# Patient Record
Sex: Female | Born: 1960 | Race: White | Hispanic: No
Health system: Southern US, Community
[De-identification: ages and names within clinical notes are randomized; demographics above are authoritative.]

## PROBLEM LIST (undated history)

## (undated) DIAGNOSIS — L503 Dermatographic urticaria: Secondary | ICD-10-CM

## (undated) DIAGNOSIS — G43909 Migraine, unspecified, not intractable, without status migrainosus: Secondary | ICD-10-CM

## (undated) DIAGNOSIS — T7840XA Allergy, unspecified, initial encounter: Secondary | ICD-10-CM

## (undated) DIAGNOSIS — F32A Depression, unspecified: Secondary | ICD-10-CM

## (undated) DIAGNOSIS — Z85828 Personal history of other malignant neoplasm of skin: Secondary | ICD-10-CM

## (undated) DIAGNOSIS — F329 Major depressive disorder, single episode, unspecified: Secondary | ICD-10-CM

## (undated) DIAGNOSIS — H9313 Tinnitus, bilateral: Secondary | ICD-10-CM

## (undated) DIAGNOSIS — M797 Fibromyalgia: Secondary | ICD-10-CM

## (undated) DIAGNOSIS — R32 Unspecified urinary incontinence: Secondary | ICD-10-CM

## (undated) DIAGNOSIS — F909 Attention-deficit hyperactivity disorder, unspecified type: Secondary | ICD-10-CM

## (undated) DIAGNOSIS — C801 Malignant (primary) neoplasm, unspecified: Secondary | ICD-10-CM

## (undated) DIAGNOSIS — E889 Metabolic disorder, unspecified: Secondary | ICD-10-CM

## (undated) DIAGNOSIS — G4730 Sleep apnea, unspecified: Secondary | ICD-10-CM

## (undated) DIAGNOSIS — E785 Hyperlipidemia, unspecified: Secondary | ICD-10-CM

## (undated) DIAGNOSIS — I10 Essential (primary) hypertension: Secondary | ICD-10-CM

## (undated) DIAGNOSIS — K219 Gastro-esophageal reflux disease without esophagitis: Secondary | ICD-10-CM

## (undated) DIAGNOSIS — J45909 Unspecified asthma, uncomplicated: Secondary | ICD-10-CM

## (undated) DIAGNOSIS — E079 Disorder of thyroid, unspecified: Secondary | ICD-10-CM

## (undated) HISTORY — DX: Fibromyalgia: M79.7

## (undated) HISTORY — DX: Essential (primary) hypertension: I10

## (undated) HISTORY — DX: Personal history of other malignant neoplasm of skin: Z85.828

## (undated) HISTORY — DX: Unspecified urinary incontinence: R32

## (undated) HISTORY — DX: Gastro-esophageal reflux disease without esophagitis: K21.9

## (undated) HISTORY — DX: Disorder of thyroid, unspecified: E07.9

## (undated) HISTORY — DX: Sleep apnea, unspecified: G47.30

## (undated) HISTORY — DX: Malignant (primary) neoplasm, unspecified: C80.1

## (undated) HISTORY — DX: Dermatographic urticaria: L50.3

## (undated) HISTORY — DX: Major depressive disorder, single episode, unspecified: F32.9

## (undated) HISTORY — DX: Tinnitus, bilateral: H93.13

## (undated) HISTORY — DX: Attention-deficit hyperactivity disorder, unspecified type: F90.9

## (undated) HISTORY — DX: Depression, unspecified: F32.A

## (undated) HISTORY — PX: ABDOMINAL HYSTERECTOMY: SHX81

## (undated) HISTORY — DX: Unspecified asthma, uncomplicated: J45.909

## (undated) HISTORY — DX: Allergy, unspecified, initial encounter: T78.40XA

## (undated) HISTORY — PX: OTHER SURGICAL HISTORY: SHX169

## (undated) HISTORY — DX: Migraine, unspecified, not intractable, without status migrainosus: G43.909

## (undated) HISTORY — DX: Metabolic disorder, unspecified: E88.9

## (undated) HISTORY — PX: COLONOSCOPY: SHX174

## (undated) HISTORY — DX: Hyperlipidemia, unspecified: E78.5

---

## 1997-12-26 DIAGNOSIS — M797 Fibromyalgia: Secondary | ICD-10-CM

## 1997-12-26 HISTORY — DX: Fibromyalgia: M79.7

## 2009-01-01 HISTORY — PX: OTHER SURGICAL HISTORY: SHX169

## 2010-01-15 HISTORY — PX: OTHER SURGICAL HISTORY: SHX169

## 2013-12-26 DIAGNOSIS — E889 Metabolic disorder, unspecified: Secondary | ICD-10-CM

## 2013-12-26 DIAGNOSIS — G4730 Sleep apnea, unspecified: Secondary | ICD-10-CM

## 2013-12-26 HISTORY — DX: Sleep apnea, unspecified: G47.30

## 2013-12-26 HISTORY — DX: Metabolic disorder, unspecified: E88.9

## 2014-10-24 HISTORY — PX: OTHER SURGICAL HISTORY: SHX169

## 2015-03-06 HISTORY — PX: OTHER SURGICAL HISTORY: SHX169

## 2015-04-03 HISTORY — PX: OTHER SURGICAL HISTORY: SHX169

## 2016-09-25 DIAGNOSIS — Z85828 Personal history of other malignant neoplasm of skin: Secondary | ICD-10-CM

## 2016-09-25 HISTORY — DX: Personal history of other malignant neoplasm of skin: Z85.828

## 2018-06-21 LAB — HM COLONOSCOPY

## 2019-02-27 ENCOUNTER — Ambulatory Visit: Payer: BLUE CROSS/BLUE SHIELD | Admitting: Family Medicine

## 2019-02-27 ENCOUNTER — Ambulatory Visit: Payer: Self-pay | Admitting: Family Medicine

## 2019-02-27 ENCOUNTER — Encounter: Payer: Self-pay | Admitting: Family Medicine

## 2019-02-27 VITALS — BP 124/82 | HR 86 | Temp 97.7°F | Ht 63.0 in | Wt 204.8 lb

## 2019-02-27 DIAGNOSIS — E8881 Metabolic syndrome: Secondary | ICD-10-CM

## 2019-02-27 DIAGNOSIS — Z0000 Encounter for general adult medical examination without abnormal findings: Secondary | ICD-10-CM

## 2019-02-27 DIAGNOSIS — G43909 Migraine, unspecified, not intractable, without status migrainosus: Secondary | ICD-10-CM

## 2019-02-27 DIAGNOSIS — Z1589 Genetic susceptibility to other disease: Secondary | ICD-10-CM | POA: Insufficient documentation

## 2019-02-27 DIAGNOSIS — M797 Fibromyalgia: Secondary | ICD-10-CM | POA: Diagnosis not present

## 2019-02-27 DIAGNOSIS — Z85828 Personal history of other malignant neoplasm of skin: Secondary | ICD-10-CM | POA: Diagnosis not present

## 2019-02-27 DIAGNOSIS — Z800 Family history of malignant neoplasm of digestive organs: Secondary | ICD-10-CM

## 2019-02-27 DIAGNOSIS — Z7689 Persons encountering health services in other specified circumstances: Secondary | ICD-10-CM

## 2019-02-27 DIAGNOSIS — E7800 Pure hypercholesterolemia, unspecified: Secondary | ICD-10-CM

## 2019-02-27 DIAGNOSIS — F988 Other specified behavioral and emotional disorders with onset usually occurring in childhood and adolescence: Secondary | ICD-10-CM

## 2019-02-27 DIAGNOSIS — B009 Herpesviral infection, unspecified: Secondary | ICD-10-CM

## 2019-02-27 DIAGNOSIS — E7212 Methylenetetrahydrofolate reductase deficiency: Secondary | ICD-10-CM

## 2019-02-27 MED ORDER — PREGABALIN 50 MG PO CAPS: 50.0000 mg | ORAL_CAPSULE | Freq: Three times a day (TID) | ORAL | 3 refills | Status: DC

## 2019-02-27 MED ORDER — VALACYCLOVIR HCL 500 MG PO TABS: 500.0000 mg | ORAL_TABLET | Freq: Every day | ORAL | 3 refills | Status: DC

## 2019-02-27 MED ORDER — DEPLIN 15 15-90.314 MG PO CAPS: 15.0000 mg | ORAL_CAPSULE | Freq: Every day | ORAL | 3 refills | Status: DC

## 2019-02-27 MED ORDER — RIZATRIPTAN BENZOATE 5 MG PO TABS: 5.0000 mg | ORAL_TABLET | ORAL | 2 refills | Status: DC | PRN

## 2019-02-27 MED ORDER — LISDEXAMFETAMINE DIMESYLATE 30 MG PO CAPS: 30.0000 mg | ORAL_CAPSULE | Freq: Every day | ORAL | 0 refills | Status: DC

## 2019-02-27 NOTE — Addendum Note (Signed)
Addended by: Ronnald Nian on: 02/27/2019 04:02 PM   Modules accepted: Orders

## 2019-02-27 NOTE — Addendum Note (Signed)
Addended by: Ronnald Nian on: 02/27/2019 04:25 PM   Modules accepted: Orders

## 2019-02-27 NOTE — Progress Notes (Signed)
Angela [name] is a 58 y.o. female  Chief Complaint  Patient presents with  . Establish Care    est care/ last colonoscopy 06/21/2018(couple polyps pre cancerous-benign)/ mammogram 2018- cysts nothing concerning/ abnormal pap december 2019-- seen oncologist came back fine/ going to find out Angela [name] is a 58 y.o. female here to establish care with our office. She recently moved from [place] to be closer to her son and her 2 grandchildren. She is from [place] originally. Pt is a retired Marine scientist.  She has worked with a naturopath in [employer] and also benefits from acupuncture.   She has multiple food restrictions due to h/o dermatographia, fibromyalgia. She has a h/o depression, bipolar. She also endorses MTHFR gene mutation and was recommended to take deplin. This has helped tremendously with her depression and mental health.  Pt takes deplin 15mg  daily and Rx is sent to [facility]. 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 She needs refill of this med.   Specialists: rheumatologist (needs referral), ortho (Dr. Alphonzo Severance and pt has appt scheduled), derm (h/o basal cell Lt lower lid)  Last PAP: 11/2018 - abnormal but further eval (? With oncology) was normal/fine Last mammo: 2018 Last colonoscopy: 05/2018 - pre-cancerous polyps as per pt (mother and MGM with h/o CRC) and pt is due in 05/2021  Exercise - 3x/wk - elliptical, treadmill. Pt states she has never been formally diagnosed with chronic fatigue but believes she has it and states it requires 2-3 days to recover from 1 workout Diet - she has multiple food/dietary restrictions and has to "limit protein intake d/t issue with sulfur metabolism"  Pt notes a h/o valley fever and was "very, very sick" x 2  years. She states she last saw pulm in 2010 and was told she needed to f/u PRN. She states she has not had a CT chest since 2010 to f/u on spot in ? Rt lung. Pt is and has never been a smoker. She endorses second hand smoke throughout childhood.   Pt takes vyvanse 30mg  PRN. She has started to take this more frequently recently as she is studying for recert of RN license. She also takes ambien PRN.   Med refills needed today: see above   Past Medical History:  Diagnosis Date  . ADHD   . Allergy   . Asthma   . Cancer ([place])    [place]  . Depression   . Fibromyalgia 1999  . GERD (gastroesophageal reflux disease)   . Hx of skin cancer, basal cell 09/2016   L lower eyelid  . Hyperlipidemia   . Metabolic disorder 8315   leaky gut  . Migraines   . Sleep apnea 2015  . Thyroid disease   . Tinnitus, bilateral   . Urine incontinence     Past Surgical History:  Procedure Laterality Date  . ABDOMINAL HYSTERECTOMY     uterus only (bladder sling)  . left knee repair  10/24/2014  . left lower eyelid BCC removal    . PRP left knee  04/03/2015  . rectocele removed  01/01/2009  . right knee arthroscopy  01/15/2010  . right shoulder removal of bone spurs bursa clean up  03/06/2015    Social History   Socioeconomic  History  . Marital status: Single    Spouse name: Not on file  . Number of children: Not on file  . Years of education: Not on file  . Highest education level: Not on file  Occupational History  . Not on file  Social Needs  . Financial resource strain: Not on file  . Food insecurity:    Worry: Not on file    Inability: Not on file  . Transportation needs:    Medical: Not on file    Non-medical: Not on file  Tobacco Use  . Smoking status: Never Smoker  . Smokeless tobacco: Never Used  Substance and Sexual Activity  . Alcohol use: Yes    Comment: social  . Drug use: Never  . Sexual activity: Not on file  Lifestyle  . Physical activity:    Days per week: Not on  file    Minutes per session: Not on file  . Stress: Not on file  Relationships  . Social connections:    Talks on phone: Not on file    Gets together: Not on file    Attends religious service: Not on file    Active member of club or organization: Not on file    Attends meetings of clubs or organizations: Not on file    Relationship status: Not on file  . Intimate partner violence:    Fear of current or ex partner: Not on file    Emotionally abused: Not on file    Physically abused: Not on file    Forced sexual activity: Not on file  Other Topics Concern  . Not on file  Social History Narrative  . Not on file    Family History  Problem Relation Age of Onset  . Arthritis Mother   . Cancer Mother   . Depression Mother   . Hyperlipidemia Mother   . Hypertension Mother   . Mental illness Mother   . Alcohol abuse Father   . Asthma Father   . COPD Father   . Heart attack Father   . Hyperlipidemia Father   . Hypertension Father   . Stroke Father   . Birth defects Brother   . Asthma Son   . Cancer Maternal Grandmother   . Heart disease Maternal Grandmother   . Cancer Maternal Grandfather   . Cancer Paternal Grandmother   . Alcohol abuse Paternal Grandmother   . Alcohol abuse Brother   . Diabetes Brother   . Learning disabilities Brother   . Cancer Brother      Immunization History  Administered Date(s) Administered  . Influenza-Unspecified 10/23/2018  . Zoster Recombinat (Shingrix) 10/23/2018    Outpatient Encounter Medications as of 02/27/2019  Medication Sig  . fluticasone (FLONASE) 50 MCG/ACT nasal spray Place into both nostrils daily.  Marland Kitchen L-Methylfolate-Algae (DEPLIN 15) 15-90.314 MG CAPS Take 15 mg by mouth daily.  Marland Kitchen Lifitegrast (XIIDRA) 5 % SOLN Apply to eye.  . lisdexamfetamine (VYVANSE) 30 MG capsule Take 30 mg by mouth daily.  Marland Kitchen omega-3 acid ethyl esters (LOVAZA) 1 g capsule Take by mouth 2 (two) times daily.  . Polyethylene Glycol 3350 (MIRALAX PO) Take by  mouth.  . pregabalin (LYRICA) 50 MG capsule Take 1 capsule (50 mg total) by mouth 3 (three) times daily.  . pregabalin (LYRICA) 50 MG capsule Take 50 mg by mouth 3 (three) times daily.  . valACYclovir (VALTREX) 500 MG tablet Take 1 tablet (500 mg total) by mouth daily.  Marland Kitchen ZINC PICOLINATE PO  Take 30 mg by mouth daily.  Marland Kitchen zolpidem (AMBIEN) 5 MG tablet Take 5 mg by mouth at bedtime as needed for sleep. 1-2/mo  . [DISCONTINUED] L-Methylfolate-Algae (DEPLIN 15 PO) Take by mouth.  . [DISCONTINUED] valACYclovir (VALTREX) 500 MG tablet Take 500 mg by mouth daily.   No facility-administered encounter medications on file as of 02/27/2019.      ROS: Pertinent positives and negatives noted in HPI. Remainder of ROS non-contributory    Allergies  Allergen Reactions  . Caffeine     Heart palpatations  . Morphine And Related     Hallucinations     BP 124/82   Pulse 86   Temp 97.7 F (36.5 C) (Oral)   Ht 5\' 3"  (1.6 m)   Wt 204 lb 12.8 oz (92.9 kg)   SpO2 95%   BMI 36.28 kg/m   Physical Exam  Constitutional: She is oriented to person, place, and time. She appears well-developed and well-nourished. No distress.  Neck: Neck supple.  Cardiovascular: Normal rate and regular rhythm.  Pulmonary/Chest: Effort normal and breath sounds normal.  Lymphadenopathy:    She has no cervical adenopathy.  Neurological: She is alert and oriented to person, place, and time.  Psychiatric: She has a normal mood and affect. Her behavior is normal.     A/P:  1. Encounter to establish care with new doctor - due for CPE, labs - pt will schedule  2. History of basal cell carcinoma (BCC) of eyelid - surgery in 2017 - Ambulatory referral to Dermatology  3. Fibromyalgia Refill - pregabalin (LYRICA) 50 MG capsule; Take 1 capsule (50 mg total) by mouth 3 (three) times daily.  Dispense: 270 capsule; Refill: 3 - Ambulatory referral to Rheumatology - controlled substance agreement signed   4. Family history  of colon cancer in mother - UTD on colonoscopy - due in 2022  5. MTHFR mutation ([place]) Refill: - L-Methylfolate-Algae (DEPLIN 15) 15-90.314 MG CAPS; Take 15 mg by mouth daily.  Dispense: 90 capsule; Refill: 3  6. HSV-2 (herpes simplex virus 2) infection Refill: - valACYclovir (VALTREX) 500 MG tablet; Take 1 tablet (500 mg total) by mouth daily.  Dispense: 90 tablet; Refill: 3  8. Attention deficit disorder, unspecified hyperactivity presence - stable Refill: - lisdexamfetamine (VYVANSE) 30 MG capsule; Take 1 capsule (30 mg total) by mouth daily.  Dispense: 30 capsule; Refill: 0 - controlled substance agreement signed   I spent 45 min of face-to-face time with the patient today and greater than 50% was spent in counseling, coordination of care, education

## 2019-03-01 ENCOUNTER — Other Ambulatory Visit (INDEPENDENT_AMBULATORY_CARE_PROVIDER_SITE_OTHER): Payer: BLUE CROSS/BLUE SHIELD

## 2019-03-01 ENCOUNTER — Other Ambulatory Visit: Payer: Self-pay | Admitting: Family Medicine

## 2019-03-01 DIAGNOSIS — E7800 Pure hypercholesterolemia, unspecified: Secondary | ICD-10-CM | POA: Diagnosis not present

## 2019-03-01 DIAGNOSIS — E8881 Metabolic syndrome: Secondary | ICD-10-CM

## 2019-03-01 DIAGNOSIS — Z0000 Encounter for general adult medical examination without abnormal findings: Secondary | ICD-10-CM

## 2019-03-01 NOTE — Addendum Note (Signed)
Addended by: Diona Foley on: 03/01/2019 03:01 PM   Modules accepted: Orders

## 2019-03-04 ENCOUNTER — Encounter: Payer: Self-pay | Admitting: Family Medicine

## 2019-03-04 ENCOUNTER — Telehealth: Payer: Self-pay

## 2019-03-04 LAB — HEMOGLOBIN A1C
HEMOGLOBIN A1C: 5.7 %{Hb} — AB (ref <5.7)
MEAN PLASMA GLUCOSE: 117 (calc)
eAG (mmol/L): 6.5 (calc)

## 2019-03-04 LAB — BASIC METABOLIC PANEL
BUN: 12 mg/dL (ref 7–25)
CO2: 23 mmol/L (ref 20–32)
Calcium: 9.3 mg/dL (ref 8.6–10.4)
Chloride: 105 mmol/L (ref 98–110)
Creat: 0.66 mg/dL (ref 0.50–1.05)
Glucose, Bld: 101 mg/dL — ABNORMAL HIGH (ref 65–99)
Potassium: 4.3 mmol/L (ref 3.5–5.3)
Sodium: 140 mmol/L (ref 135–146)

## 2019-03-04 LAB — VITAMIN D 25 HYDROXY (VIT D DEFICIENCY, FRACTURES): Vit D, 25-Hydroxy: 82 ng/mL (ref 30–100)

## 2019-03-04 LAB — LIPID PANEL
CHOL/HDL RATIO: 7.5 (calc) — AB (ref <5.0)
CHOLESTEROL: 301 mg/dL — AB (ref <200)
HDL: 40 mg/dL — ABNORMAL LOW (ref >=50)
LDL Cholesterol (Calc): 208 mg/dL (calc) — ABNORMAL HIGH
Non-HDL Cholesterol (Calc): 261 mg/dL (calc) — ABNORMAL HIGH (ref <130)
Triglycerides: 311 mg/dL — ABNORMAL HIGH (ref <150)

## 2019-03-04 LAB — AST: AST: 27 U/L (ref 10–35)

## 2019-03-04 LAB — INSULIN, RANDOM: Insulin: 14.9 u[IU]/mL

## 2019-03-04 LAB — ALT: ALT: 52 U/L — AB (ref 6–29)

## 2019-03-04 NOTE — Telephone Encounter (Signed)
Copied from [name] [id number]. Topic: Referral - Status >> Mar 04, 2019  8:00 AM Oneta Rack wrote: Osvaldo Human name: Angela [name] from [facility] Call back number: [phone]   Reason for call:  As per office they don't see fibromyalgia patients, please advise

## 2019-03-06 ENCOUNTER — Encounter: Payer: Self-pay | Admitting: Family Medicine

## 2019-03-06 ENCOUNTER — Encounter: Payer: BLUE CROSS/BLUE SHIELD | Admitting: Family Medicine

## 2019-03-06 DIAGNOSIS — R7301 Impaired fasting glucose: Secondary | ICD-10-CM | POA: Insufficient documentation

## 2019-03-06 DIAGNOSIS — Z713 Dietary counseling and surveillance: Secondary | ICD-10-CM | POA: Diagnosis not present

## 2019-03-06 NOTE — Telephone Encounter (Signed)
Angela [name] can you help [name] with this referral?

## 2019-03-11 ENCOUNTER — Ambulatory Visit (INDEPENDENT_AMBULATORY_CARE_PROVIDER_SITE_OTHER): Payer: BLUE CROSS/BLUE SHIELD

## 2019-03-11 ENCOUNTER — Ambulatory Visit (INDEPENDENT_AMBULATORY_CARE_PROVIDER_SITE_OTHER): Payer: BLUE CROSS/BLUE SHIELD | Admitting: Orthopedic Surgery

## 2019-03-11 ENCOUNTER — Other Ambulatory Visit: Payer: Self-pay

## 2019-03-11 ENCOUNTER — Ambulatory Visit (INDEPENDENT_AMBULATORY_CARE_PROVIDER_SITE_OTHER): Payer: Self-pay

## 2019-03-11 ENCOUNTER — Encounter (INDEPENDENT_AMBULATORY_CARE_PROVIDER_SITE_OTHER): Payer: Self-pay | Admitting: Orthopedic Surgery

## 2019-03-11 DIAGNOSIS — M7542 Impingement syndrome of left shoulder: Secondary | ICD-10-CM

## 2019-03-11 DIAGNOSIS — M79602 Pain in left arm: Secondary | ICD-10-CM | POA: Diagnosis not present

## 2019-03-13 ENCOUNTER — Other Ambulatory Visit: Payer: Self-pay

## 2019-03-13 ENCOUNTER — Encounter: Payer: Self-pay | Admitting: Family Medicine

## 2019-03-13 ENCOUNTER — Ambulatory Visit (INDEPENDENT_AMBULATORY_CARE_PROVIDER_SITE_OTHER): Payer: BLUE CROSS/BLUE SHIELD | Admitting: Family Medicine

## 2019-03-13 ENCOUNTER — Encounter (INDEPENDENT_AMBULATORY_CARE_PROVIDER_SITE_OTHER): Payer: Self-pay | Admitting: Orthopedic Surgery

## 2019-03-13 VITALS — BP 140/88 | HR 78 | Temp 97.6°F | Ht 63.0 in | Wt 204.2 lb

## 2019-03-13 DIAGNOSIS — N6019 Diffuse cystic mastopathy of unspecified breast: Secondary | ICD-10-CM

## 2019-03-13 DIAGNOSIS — Z0000 Encounter for general adult medical examination without abnormal findings: Secondary | ICD-10-CM

## 2019-03-13 DIAGNOSIS — M62830 Muscle spasm of back: Secondary | ICD-10-CM

## 2019-03-13 DIAGNOSIS — Z23 Encounter for immunization: Secondary | ICD-10-CM

## 2019-03-13 DIAGNOSIS — Z1239 Encounter for other screening for malignant neoplasm of breast: Secondary | ICD-10-CM | POA: Diagnosis not present

## 2019-03-13 DIAGNOSIS — Z8639 Personal history of other endocrine, nutritional and metabolic disease: Secondary | ICD-10-CM

## 2019-03-13 MED ORDER — CYCLOBENZAPRINE HCL 5 MG PO TABS: ORAL_TABLET | ORAL | 0 refills | Status: DC

## 2019-03-13 NOTE — Progress Notes (Signed)
Office Visit Note   Patient: Angela [name]           Date of Birth: [birth year]           MRN: [id number] Visit Date: 03/11/2019 Requested by: No referring provider defined for this encounter. PCP: Ronnald Nian, DO  Subjective: Chief Complaint  Patient presents with  . Left Shoulder - Pain    HPI: Angela [name] is a patient with left shoulder pain.  She recently moved here from [place].  She is [name] to have surgery a year ago.  She is left-hand dominant.  She does report some neck pain and occasional radicular symptoms.  Unable to lay on that left-hand side.  She had a "bone spur surgery" in [place] on the right-hand side and did well with that.  She states the left shoulder is now following the same path in terms of clinical symptoms.  Hard for her to use her left arm.  Symptoms really worsened after she picked up her granddaughter in November 2019.  Physical therapy does help.  She uses Tylenol.  She has not had and does not want a subacromial injection.  She does have a history of fibromyalgia.  She reports decreased quality of life.              ROS: All systems reviewed are negative as they relate to the chief complaint within the history of present illness.  Patient denies  fevers or chills.   Assessment & Plan: Visit Diagnoses:  1. Left arm pain     Plan: Impression is left arm pain with likely some degree of bursitis.  I think radiculopathy is possible but less likely based on her exam.  Symptoms have been going on now for a year.  She is failed conservative management.  Needs MRI arthrogram left shoulder to evaluate for bursitis versus rotator cuff tear.  I will see her back after that study and we can proceed from there with either surgical or further nonsurgical intervention.  Follow-Up Instructions: Return for after MRI.   Orders:  Orders Placed This Encounter  Procedures  . XR Shoulder Left  . XR Cervical Spine 2 or 3 views  . MR Shoulder Left w/ contrast  .  Arthrogram   No orders of the defined types were placed in this encounter.     Procedures: No procedures performed   Clinical Data: No additional findings.  Objective: Vital Signs: There were no vitals taken for this visit.  Physical Exam:   Constitutional: Patient appears well-developed HEENT:  Head: Normocephalic Eyes:EOM are normal Neck: Normal range of motion Cardiovascular: Normal rate Pulmonary/chest: Effort normal Neurologic: Patient is alert Skin: Skin is warm Psychiatric: Patient has normal mood and affect    Ortho Exam: Ortho exam demonstrates full active and passive range of motion of the right and left shoulder.  Impingement signs positive on the left negative on the right.  Cervical spine range of motion is good.  No definite paresthesias C5-T1.  Radial pulse intact bilaterally.  No restriction of passive range of motion on the left compared to the right.  Rotator cuff strength is good isolated infraspinatus supraspinatus and subscap muscle testing.  No discrete AC joint tenderness to direct palpation left versus right.  Impingement signs negative on the right equivocal on the left  Specialty Comments:  No specialty comments available.  Imaging: No results found.   PMFS History: Patient Active Problem List   Diagnosis Date Noted  . Impaired fasting glucose  03/06/2019  . History of basal cell carcinoma (BCC) of eyelid 02/27/2019  . Fibromyalgia 02/27/2019  . 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 MTHFR mutation ([place]) 02/27/2019  . Family history of colon cancer in mother 02/27/2019  . Metabolic syndrome 22/48/2500   Past Medical History:  Diagnosis Date  . ADHD   . Allergy   . Asthma   . Cancer ([place])    [place]  . Depression   . Fibromyalgia 1999  . GERD (gastroesophageal reflux disease)   . Hx of skin cancer, basal cell 09/2016   L lower eyelid  . Hyperlipidemia   . Metabolic disorder 3704   leaky gut  . Migraines   . Sleep  apnea 2015  . Thyroid disease   . Tinnitus, bilateral   . Urine incontinence     Family History  Problem Relation Age of Onset  . Arthritis Mother   . Cancer Mother   . Depression Mother   . Hyperlipidemia Mother   . Hypertension Mother   . Mental illness Mother   . Alcohol abuse Father   . Asthma Father   . COPD Father   . Heart attack Father   . Hyperlipidemia Father   . Hypertension Father   . Stroke Father   . Birth defects Brother   . Asthma Son   . Cancer Maternal Grandmother   . Heart disease Maternal Grandmother   . Cancer Maternal Grandfather   . Cancer Paternal Grandmother   . Alcohol abuse Paternal Grandmother   . Alcohol abuse Brother   . Diabetes Brother   . Learning disabilities Brother   . Cancer Brother     Past Surgical History:  Procedure Laterality Date  . ABDOMINAL HYSTERECTOMY     uterus only (bladder sling)  . left knee repair  10/24/2014  . left lower eyelid BCC removal    . PRP left knee  04/03/2015  . rectocele removed  01/01/2009  . right knee arthroscopy  01/15/2010  . right shoulder removal of bone spurs bursa clean up  03/06/2015   Social History   Occupational History  . Not on file  Tobacco Use  . Smoking status: Never Smoker  . Smokeless tobacco: Never Used  Substance and Sexual Activity  . Alcohol use: Yes    Comment: social  . Drug use: Never  . Sexual activity: Not on file

## 2019-03-13 NOTE — Progress Notes (Signed)
Angela [name] is a 58 y.o. female  Chief Complaint  Patient presents with  . Establish Care    CPE/lab work done    HPI: Angela [name] is a 58 y.o. female here for CPE. She had labs done last week.   Since last OV with me, she saw ortho Dr. Marlou Sa on 3/16 and was diagnosed with Lt shoulder bursitis. Shoulder xray and cervical spine xray were normal. Plan, as per pt, is for MRI to r/o labral tear.   Pt states she is due for mammogram. She has fibrocystic breast disease. She needs screening mammo and for the past few years has had B/L ultrasound at same time as mammo.   She had pneumonia vaccine (3 years ago), shingrix vaccine, flu vaccine this season. She believes her last tetanus was 7+ years ago.   Last PAP: 11/2018 Last mammo: 2018 Last colonoscopy: 05/2018 - due in 05/2021  Med refills needed today? none   Past Medical History:  Diagnosis Date  . ADHD   . Allergy   . Asthma   . Cancer ([place])    [place]  . Depression   . Fibromyalgia 1999  . GERD (gastroesophageal reflux disease)   . Hx of skin cancer, basal cell 09/2016   L lower eyelid  . Hyperlipidemia   . Metabolic disorder 7829   leaky gut  . Migraines   . Sleep apnea 2015  . Thyroid disease   . Tinnitus, bilateral   . Urine incontinence     Past Surgical History:  Procedure Laterality Date  . ABDOMINAL HYSTERECTOMY     uterus only (bladder sling)  . left knee repair  10/24/2014  . left lower eyelid BCC removal    . PRP left knee  04/03/2015  . rectocele removed  01/01/2009  . right knee arthroscopy  01/15/2010  . right shoulder removal of bone spurs bursa clean up  03/06/2015    Social History   Socioeconomic History  . Marital status: Single    Spouse name: Not on file  . Number of children: Not on file  . Years of education: Not on file  . Highest education level: Not on file  Occupational History  . Not on file  Social Needs  . Financial resource strain: Not on file  . Food  insecurity:    Worry: Not on file    Inability: Not on file  . Transportation needs:    Medical: Not on file    Non-medical: Not on file  Tobacco Use  . Smoking status: Never Smoker  . Smokeless tobacco: Never Used  Substance and Sexual Activity  . Alcohol use: Yes    Comment: social  . Drug use: Never  . Sexual activity: Not on file  Lifestyle  . Physical activity:    Days per week: Not on file    Minutes per session: Not on file  . Stress: Not on file  Relationships  . Social connections:    Talks on phone: Not on file    Gets together: Not on file    Attends religious service: Not on file    Active member of club or organization: Not on file    Attends meetings of clubs or organizations: Not on file    Relationship status: Not on file  . Intimate partner violence:    Fear of current or ex partner: Not on file    Emotionally abused: Not on file    Physically abused: Not on file  Forced sexual activity: Not on file  Other Topics Concern  . Not on file  Social History Narrative  . Not on file    Family History  Problem Relation Age of Onset  . Arthritis Mother   . Cancer Mother   . Depression Mother   . Hyperlipidemia Mother   . Hypertension Mother   . Mental illness Mother   . Alcohol abuse Father   . Asthma Father   . COPD Father   . Heart attack Father   . Hyperlipidemia Father   . Hypertension Father   . Stroke Father   . Birth defects Brother   . Asthma Son   . Cancer Maternal Grandmother   . Heart disease Maternal Grandmother   . Cancer Maternal Grandfather   . Cancer Paternal Grandmother   . Alcohol abuse Paternal Grandmother   . Alcohol abuse Brother   . Diabetes Brother   . Learning disabilities Brother   . Cancer Brother      Immunization History  Administered Date(s) Administered  . Influenza-Unspecified 10/23/2018  . Zoster Recombinat (Shingrix) 10/23/2018    Outpatient Encounter Medications as of 03/13/2019  Medication Sig  .  fluticasone (FLONASE) 50 MCG/ACT nasal spray Place into both nostrils daily.  Marland Kitchen L-Methylfolate-Algae (DEPLIN 15) 15-90.314 MG CAPS Take 15 mg by mouth daily.  Marland Kitchen Lifitegrast (XIIDRA) 5 % SOLN Apply to eye.  . lisdexamfetamine (VYVANSE) 30 MG capsule Take 1 capsule (30 mg total) by mouth daily.  Marland Kitchen omega-3 acid ethyl esters (LOVAZA) 1 g capsule Take by mouth 2 (two) times daily.  . Polyethylene Glycol 3350 (MIRALAX PO) Take by mouth.  . pregabalin (LYRICA) 50 MG capsule Take 1 capsule (50 mg total) by mouth 3 (three) times daily.  . pregabalin (LYRICA) 50 MG capsule Take 50 mg by mouth 3 (three) times daily.  . rizatriptan (MAXALT) 5 MG tablet Take 1 tablet (5 mg total) by mouth as needed for migraine. May repeat in 2 hours if needed  . valACYclovir (VALTREX) 500 MG tablet Take 1 tablet (500 mg total) by mouth daily.  Marland Kitchen ZINC PICOLINATE PO Take 30 mg by mouth daily.  Marland Kitchen zolpidem (AMBIEN) 5 MG tablet Take 5 mg by mouth at bedtime as needed for sleep. 1-2/mo  . cyclobenzaprine (FLEXERIL) 5 MG tablet 1-2 tabs po qHS PRN   No facility-administered encounter medications on file as of 03/13/2019.      ROS: Gen: no fever, chills  Skin: + dermatographism  ENT: no ear pain, ear drainage, nasal congestion, rhinorrhea, sinus pressure, sore throat Eyes: no blurry vision, double vision Resp: no cough, wheeze, SOB Breast: + breast tenderness - chronic, no nipple discharge, h/o fibrocystic breast disease CV: no CP, palpitations, LE edema,  GI: no heartburn, n/v/d/c, abd pain GU: no dysuria, urgency, frequency, hematuria MSK: no joint pain, myalgias, + back pain Neuro: no dizziness, headache, weakness Psych: + depression, + anxiety, insomnia  Allergies  Allergen Reactions  . Caffeine     Heart palpatations  . Morphine And Related     Hallucinations     BP 140/88   Pulse 78   Temp 97.6 F (36.4 C) (Oral)   Ht 5\' 3"  (1.6 m)   Wt 204 lb 3.2 oz (92.6 kg)   SpO2 97%   BMI 36.17 kg/m   BP  Readings from Last 3 Encounters:  03/13/19 140/88  02/27/19 124/82     Physical Exam  Constitutional: She is oriented to person, place, and time. She  appears well-developed and well-nourished. No distress.  HENT:  Head: Normocephalic and atraumatic.  Right Ear: Tympanic membrane and ear canal normal.  Left Ear: Tympanic membrane and ear canal normal.  Nose: Nose normal.  Mouth/Throat: Oropharynx is clear and moist and mucous membranes are normal.  Neck: Neck supple. No thyromegaly present.  Cardiovascular: Normal rate, regular rhythm and normal heart sounds.  No murmur heard. Pulmonary/Chest: Effort normal and breath sounds normal. No respiratory distress.  Abdominal: Soft. Bowel sounds are normal. She exhibits no distension and no mass. There is no abdominal tenderness.  Musculoskeletal:        General: No edema.  Lymphadenopathy:    She has no cervical adenopathy.  Neurological: She is alert and oriented to person, place, and time. Coordination normal.  Skin: Skin is warm and dry.  Psychiatric: She has a normal mood and affect. Her behavior is normal.     A/P:  1. Screening for breast cancer 2. Fibrocystic breast disease (FCBD) in female, unspecified laterality - MM DIGITAL SCREENING BILATERAL; Future - US BREAST COMPLETE UNI RIGHT INC AXILLA; Future - US BREAST COMPLETE UNI LEFT INC AXILLA; Future  3. Annual physical exam - labs done previously and reviewed today - UTD on dental and vision exams - discussed importance of regular CV exercise, healthy diet, adequate sleep - immunizations UTD - next CPE in 1 year  4. Need for Tdap vaccination - Tdap vaccine greater than or equal to 7yo IM  5. Spasm of muscle of lower back - heating pad 2-3x/day Rx: - cyclobenzaprine (FLEXERIL) 5 MG tablet; 1-2 tabs po qHS PRN  Dispense: 20 tablet; Refill: 0 - f/u PRN  6. H/O thyroid cyst - US THYROID; Future

## 2019-03-13 NOTE — Patient Instructions (Signed)

## 2019-03-15 ENCOUNTER — Encounter: Payer: Self-pay | Admitting: Family Medicine

## 2019-03-18 ENCOUNTER — Other Ambulatory Visit: Payer: Self-pay

## 2019-03-18 ENCOUNTER — Ambulatory Visit
Admission: RE | Admit: 2019-03-18 | Discharge: 2019-03-18 | Disposition: A | Discharge status: Home / Self Care | Payer: BLUE CROSS/BLUE SHIELD | Source: Ambulatory Visit | Attending: Family Medicine | Admitting: Family Medicine

## 2019-03-18 ENCOUNTER — Encounter: Payer: Self-pay | Admitting: Family Medicine

## 2019-03-18 DIAGNOSIS — E042 Nontoxic multinodular goiter: Secondary | ICD-10-CM | POA: Diagnosis not present

## 2019-03-18 DIAGNOSIS — Z8639 Personal history of other endocrine, nutritional and metabolic disease: Secondary | ICD-10-CM

## 2019-03-19 ENCOUNTER — Encounter: Payer: Self-pay | Admitting: Family Medicine

## 2019-03-19 DIAGNOSIS — E042 Nontoxic multinodular goiter: Secondary | ICD-10-CM

## 2019-04-05 ENCOUNTER — Ambulatory Visit: Payer: BLUE CROSS/BLUE SHIELD

## 2019-04-05 ENCOUNTER — Other Ambulatory Visit: Payer: Self-pay | Admitting: Family Medicine

## 2019-04-05 ENCOUNTER — Other Ambulatory Visit: Payer: Self-pay

## 2019-04-05 ENCOUNTER — Ambulatory Visit
Admission: RE | Admit: 2019-04-05 | Discharge: 2019-04-05 | Disposition: A | Discharge status: Home / Self Care | Payer: BLUE CROSS/BLUE SHIELD | Source: Ambulatory Visit | Attending: Family Medicine | Admitting: Family Medicine

## 2019-04-05 DIAGNOSIS — N6019 Diffuse cystic mastopathy of unspecified breast: Secondary | ICD-10-CM | POA: Diagnosis not present

## 2019-04-05 DIAGNOSIS — Z1239 Encounter for other screening for malignant neoplasm of breast: Secondary | ICD-10-CM

## 2019-04-15 ENCOUNTER — Ambulatory Visit: Payer: BLUE CROSS/BLUE SHIELD | Admitting: Internal Medicine

## 2019-04-15 NOTE — Progress Notes (Deleted)
Name: Angela [name]  MRN/ DOB: [id number], 1961/08/16    Age/ Sex: 58 y.o., female    PCP: Ronnald Nian, DO   Reason for Endocrinology Evaluation: MNG     Date of Initial Endocrinology Evaluation: 04/15/2019     HPI: Ms. Angela [name] is a 58 y.o. female with a past medical history of Depression, GERD,and fibromylagia. The patient presented for initial endocrinology clinic visit on 04/15/2019 for consultative assistance with her MNG.   ***  HISTORY:  Past Medical History:  Past Medical History:  Diagnosis Date  . ADHD   . Allergy   . Asthma   . Cancer ([place])    [place]  . Depression   . Fibromyalgia 1999  . GERD (gastroesophageal reflux disease)   . Hx of skin cancer, basal cell 09/2016   L lower eyelid  . Hyperlipidemia   . Metabolic disorder 0865   leaky gut  . Migraines   . Sleep apnea 2015  . Thyroid disease   . Tinnitus, bilateral   . Urine incontinence     Past Surgical History:  Past Surgical History:  Procedure Laterality Date  . ABDOMINAL HYSTERECTOMY     uterus only (bladder sling)  . left knee repair  10/24/2014  . left lower eyelid BCC removal    . PRP left knee  04/03/2015  . rectocele removed  01/01/2009  . right knee arthroscopy  01/15/2010  . right shoulder removal of bone spurs bursa clean up  03/06/2015      Social History:  reports that she has never smoked. She has never used smokeless tobacco. She reports current alcohol use. She reports that she does not use drugs.  Family History: family history includes Alcohol abuse in her brother, father, and paternal grandmother; Arthritis in her mother; Asthma in her father and son; Birth defects in her brother; COPD in her father; Cancer in her brother, maternal grandfather, maternal grandmother, mother, and paternal grandmother; Depression in her mother; Diabetes in her brother; Heart attack in her father; Heart disease in her maternal grandmother; Hyperlipidemia in her father and  mother; Hypertension in her father and mother; Learning disabilities in her brother; Mental illness in her mother; Stroke in her father.   HOME MEDICATIONS: Allergies as of 04/15/2019      Reactions   Caffeine    Heart palpatations   Morphine And Related    Hallucinations      Medication List       Accurate as of April 15, 2019 11:13 AM. Always use your most recent med list.        Ambien 5 MG tablet Generic drug:  zolpidem Take 5 mg by mouth at bedtime as needed for sleep. 1-2/mo   cyclobenzaprine 5 MG tablet Commonly known as:  FLEXERIL 1-2 tabs po qHS PRN   Deplin 15 15-90.314 MG Caps Take 15 mg by mouth daily.   fluticasone 50 MCG/ACT nasal spray Commonly known as:  FLONASE Place into both nostrils daily.   lisdexamfetamine 30 MG capsule Commonly known as:  VYVANSE Take 1 capsule (30 mg total) by mouth daily.   MIRALAX PO Take by mouth.   omega-3 acid ethyl esters 1 g capsule Commonly known as:  LOVAZA Take by mouth 2 (two) times daily.   pregabalin 50 MG capsule Commonly known as:  LYRICA Take 50 mg by mouth 3 (three) times daily.   pregabalin 50 MG capsule Commonly known as:  Lyrica Take 1 capsule (50 mg total)  by mouth 3 (three) times daily.   rizatriptan 5 MG tablet Commonly known as:  Maxalt Take 1 tablet (5 mg total) by mouth as needed for migraine. May repeat in 2 hours if needed   valACYclovir 500 MG tablet Commonly known as:  VALTREX Take 1 tablet (500 mg total) by mouth daily.   Xiidra 5 % Soln Generic drug:  Lifitegrast Apply to eye.   ZINC PICOLINATE PO Take 30 mg by mouth daily.         REVIEW OF SYSTEMS: A comprehensive ROS was conducted with the patient and is negative except as per HPI and below:  ROS     OBJECTIVE:  VS: There were no vitals taken for this visit.   Wt Readings from Last 3 Encounters:  03/13/19 204 lb 3.2 oz (92.6 kg)  02/27/19 204 lb 12.8 oz (92.9 kg)     EXAM: General: Pt appears well and is  in NAD  Hydration: Well-hydrated with moist mucous membranes and good skin turgor  Eyes: External eye exam normal without stare, lid lag or exophthalmos.  EOM intact.  PERRL.  Ears, Nose, Throat: Hearing: Grossly intact bilaterally Dental: Good dentition  Throat: Clear without mass, erythema or exudate  Neck: General: Supple without adenopathy. Thyroid: Thyroid size normal.  No goiter or nodules appreciated. No thyroid bruit.  Lungs: Clear with good BS bilat with no rales, rhonchi, or wheezes  Heart: Auscultation: RRR.  Abdomen: Normoactive bowel sounds, soft, nontender, without masses or organomegaly palpable  Extremities: Gait and station: Normal gait  Digits and nails: No clubbing, cyanosis, petechiae, or nodes Head and neck: Normal alignment and mobility BL UE: Normal ROM and strength. BL LE: No pretibial edema normal ROM and strength.  Skin: Hair: Texture and amount normal with gender appropriate distribution Skin Inspection: No rashes, acanthosis nigricans/skin tags. No lipohypertrophy Skin Palpation: Skin temperature, texture, and thickness normal to palpation  Neuro: Cranial nerves: II - XII grossly intact  Cerebellar: Normal coordination and movement; no tremor Motor: Normal strength throughout DTRs: 2+ and symmetric in UE without delay in relaxation phase  Mental Status: Judgment, insight: Intact Orientation: Oriented to time, place, and person Memory: Intact for recent and remote events Mood and affect: No depression, anxiety, or agitation     DATA REVIEWED:  Thyroid Ultrasound (03/18/2019)  Nodule # 1:  Location: Right; Superior  Maximum size: 1.7 cm; Other 2 dimensions: 1.1 x 0.8 cm  Composition: solid/almost completely solid (2)  Echogenicity: isoechoic (1)  Shape: not taller-than-wide (0)  Margins: ill-defined (0)  Echogenic foci: none (0)  ACR TI-RADS total points: 3.  ACR TI-RADS risk category: TR3 (3 points).  ACR TI-RADS  recommendations:  *Given size (>/= 1.5 - 2.4 cm) and appearance, a follow-up ultrasound in 1 year should be considered based on TI-RADS criteria.  _________________________________________________________  Nodule # 2:  Location: Right; Mid  Maximum size: 1.4 cm; Other 2 dimensions: 1.2 x 1 cm  Composition: mixed cystic and solid (1)  Echogenicity: isoechoic (1)  Shape: not taller-than-wide (0)  Margins: ill-defined (0)  Echogenic foci: none (0)  ACR TI-RADS total points: 2.  ACR TI-RADS risk category: TR2 (2 points).  ACR TI-RADS recommendations:  This nodule does NOT meet TI-RADS criteria for biopsy or dedicated follow-up.  _________________________________________________________  Nodule # 3:  Location: Right; Inferior  Maximum size: 1.8 cm; Other 2 dimensions: 1.2 x 1 cm  Composition: solid/almost completely solid (2)  Echogenicity: hypoechoic (2)  Shape: not taller-than-wide (0)  Margins:  ill-defined (0)  Echogenic foci:  ACR TI-RADS total points: 4.  ACR TI-RADS risk category: TR4 (4-6 points).  ACR TI-RADS recommendations:  **Given size (>/= 1.5 cm) and appearance, fine needle aspiration of this moderately suspicious nodule should be considered based on TI-RADS criteria.  _________________________________________________________  Nodule # 4:  Location: Left; Mid  Maximum size: 0.7 cm; Other 2 dimensions: 0.6 x 0.6 cm  Composition: solid/almost completely solid (2)  Echogenicity: hypoechoic (2)  Shape: not taller-than-wide (0)  Margins: ill-defined (0)  Echogenic foci: none (0)  ACR TI-RADS total points: 4.  ACR TI-RADS risk category: TR4 (4-6 points).  ACR TI-RADS recommendations:  Given size (<0.9 cm) and appearance, this nodule does NOT meet TI-RADS criteria for biopsy or dedicated follow-up.  IMPRESSION: 1. Mild thyromegaly with bilateral nodules. 2. In the absence of prior  studies documenting long-term stability, recommend FNA biopsy of moderately suspicious 1.8 cm inferior right nodule. 3. Recommend annual/biennial ultrasound follow-up of superior right nodule as above, until stability x5 years confirmed.     ASSESSMENT/PLAN/RECOMMENDATIONS:   1. Multinodular Goiter :    Medications :  Signed electronically by: Mack Guise, MD  [facility] Endocrinology  [facility] [address] [place], [place] [address] Phone: [phone] FAX: [phone]   CC: Ronnald Nian, [place] [address] Phone: [phone] Fax: [phone]   Return to Endocrinology clinic as below: Future Appointments  Date Time Provider [place]  04/15/2019  1:20 PM Angelo Prindle, Melanie Crazier, MD [id number] None

## 2019-04-17 ENCOUNTER — Other Ambulatory Visit: Payer: BLUE CROSS/BLUE SHIELD

## 2019-04-22 ENCOUNTER — Encounter: Payer: Self-pay | Admitting: Internal Medicine

## 2019-04-22 ENCOUNTER — Ambulatory Visit: Payer: BLUE CROSS/BLUE SHIELD | Admitting: Internal Medicine

## 2019-04-22 ENCOUNTER — Other Ambulatory Visit: Payer: Self-pay

## 2019-04-22 VITALS — BP 142/78 | HR 85 | Temp 98.0°F | Ht 63.0 in | Wt 204.6 lb

## 2019-04-22 DIAGNOSIS — E042 Nontoxic multinodular goiter: Secondary | ICD-10-CM

## 2019-04-22 NOTE — Progress Notes (Signed)
Name: Angela [name]  MRN/ DOB: [id number], 03/22/1961    Age/ Sex: 58 y.o., female    PCP: Ronnald Nian, DO   Reason for Endocrinology Evaluation: MNG     Date of Initial Endocrinology Evaluation: 04/22/2019     HPI: Angela [name] is a 58 y.o. female with a past medical history of Depression, GERD,dermatographia and fibromylagia. The patient presented for initial endocrinology clinic visit on 04/22/2019 for consultative assistance with her MNG.   Moved from [facility]   She has had thyroid nodules for ~ 8 yrs. This was followed by ENT in [place]. No prior biopsies.   Four years ago she was on levothyroxine for ~ 1.5 yrs, with normal TFT's but with hypothyroid symptoms. She eventually developed hyperthyroid symptoms and had to stop it.   Recently she denies local neck symptoms.  She has noted weight gain, and has chronic constipation      Materanal GM goiter Mother- hypothyroidism Brother - hyperthyroidism  HISTORY:  Past Medical History:  Past Medical History:  Diagnosis Date  . ADHD   . Allergy   . Asthma   . Cancer ([place])    [place]  . Depression   . Fibromyalgia 1999  . GERD (gastroesophageal reflux disease)   . Hx of skin cancer, basal cell 09/2016   L lower eyelid  . Hyperlipidemia   . Metabolic disorder 2423   leaky gut  . Migraines   . Sleep apnea 2015  . Thyroid disease   . Tinnitus, bilateral   . Urine incontinence    Past Surgical History:  Past Surgical History:  Procedure Laterality Date  . ABDOMINAL HYSTERECTOMY     uterus only (bladder sling)  . left knee repair  10/24/2014  . left lower eyelid BCC removal    . PRP left knee  04/03/2015  . rectocele removed  01/01/2009  . right knee arthroscopy  01/15/2010  . right shoulder removal of bone spurs bursa clean up  03/06/2015      Social History:  reports that she has never smoked. She has never used smokeless tobacco. She reports current alcohol use. She reports that she does not use  drugs.  Family History: family history includes Alcohol abuse in her brother, father, and paternal grandmother; Arthritis in her mother; Asthma in her father and son; Birth defects in her brother; COPD in her father; Cancer in her brother, maternal grandfather, maternal grandmother, mother, and paternal grandmother; Depression in her mother; Diabetes in her brother; Heart attack in her father; Heart disease in her maternal grandmother; Hyperlipidemia in her father and mother; Hypertension in her father and mother; Learning disabilities in her brother; Mental illness in her mother; Stroke in her father.   HOME MEDICATIONS: Allergies as of 04/22/2019      Reactions   Caffeine    Heart palpatations   Morphine And Related    Hallucinations      Medication List       Accurate as of April 22, 2019  2:37 PM. Always use your most recent med list.        Ambien 5 MG tablet Generic drug:  zolpidem Take 5 mg by mouth at bedtime as needed for sleep. 1-2/mo   cyclobenzaprine 5 MG tablet Commonly known as:  FLEXERIL 1-2 tabs po qHS PRN   Deplin 15 15-90.314 MG Caps Take 15 mg by mouth daily.   fluticasone 50 MCG/ACT nasal spray Commonly known as:  FLONASE Place into both nostrils daily.  lisdexamfetamine 30 MG capsule Commonly known as:  VYVANSE Take 1 capsule (30 mg total) by mouth daily.   MIRALAX PO Take by mouth.   omega-3 acid ethyl esters 1 g capsule Commonly known as:  LOVAZA Take by mouth 2 (two) times daily.   pregabalin 50 MG capsule Commonly known as:  LYRICA Take 50 mg by mouth 3 (three) times daily.   pregabalin 50 MG capsule Commonly known as:  Lyrica Take 1 capsule (50 mg total) by mouth 3 (three) times daily.   rizatriptan 5 MG tablet Commonly known as:  Maxalt Take 1 tablet (5 mg total) by mouth as needed for migraine. May repeat in 2 hours if needed   valACYclovir 500 MG tablet Commonly known as:  VALTREX Take 1 tablet (500 mg total) by mouth daily.    Xiidra 5 % Soln Generic drug:  Lifitegrast Apply to eye.   ZINC PICOLINATE PO Take 30 mg by mouth daily.         REVIEW OF SYSTEMS: A comprehensive ROS was conducted with the patient and is negative except as per HPI and below:  Review of Systems  Constitutional: Negative for weight loss.  HENT: Negative for congestion and sore throat.   Eyes: Positive for pain. Negative for blurred vision.       Chronic dry eye   Respiratory: Negative for cough and shortness of breath.   Cardiovascular: Positive for palpitations. Negative for chest pain.       Only when she lays down   Gastrointestinal: Positive for constipation. Negative for nausea.  Genitourinary: Positive for urgency. Negative for frequency.  Musculoskeletal: Positive for joint pain and myalgias.  Neurological: Positive for tingling. Negative for tremors.  Endo/Heme/Allergies: Positive for polydipsia.  Psychiatric/Behavioral: Negative for depression. The patient is not nervous/anxious.        OBJECTIVE:  VS: BP (!) 142/78 (BP Location: Left Arm, Patient Position: Sitting, Cuff Size: Normal)   Pulse 85   Temp 98 F (36.7 C)   Ht 5\' 3"  (1.6 m)   Wt 204 lb 9.6 oz (92.8 kg)   SpO2 97%   BMI 36.24 kg/m    Wt Readings from Last 3 Encounters:  04/22/19 204 lb 9.6 oz (92.8 kg)  03/13/19 204 lb 3.2 oz (92.6 kg)  02/27/19 204 lb 12.8 oz (92.9 kg)     EXAM: General: Pt appears well and is in NAD  Hydration: Well-hydrated with moist mucous membranes and good skin turgor  Eyes: External eye exam normal without stare, lid lag or exophthalmos.  EOM intact.  PERRL.  Ears, Nose, Throat: Hearing: Grossly intact bilaterally Dental: Good dentition  Throat: Clear without mass, erythema or exudate  Neck: General: Supple without adenopathy. Thyroid: Thyroid size normal.  No goiter or nodules appreciated. No thyroid bruit.  Lungs: Clear with good BS bilat with no rales, rhonchi, or wheezes  Heart: Auscultation: RRR.  Abdomen:  Normoactive bowel sounds, soft, nontender, without masses or organomegaly palpable  Extremities: Gait and station: Normal gait  Digits and nails: No clubbing, cyanosis, petechiae, or nodes Head and neck: Normal alignment and mobility BL UE: Normal ROM and strength. BL LE: No pretibial edema normal ROM and strength.  Skin: Hair: Texture and amount normal with gender appropriate distribution Skin Inspection: No rashes, acanthosis nigricans/skin tags. No lipohypertrophy Skin Palpation: Skin temperature, texture, and thickness normal to palpation  Neuro: Cranial nerves: II - XII grossly intact  Cerebellar: Normal coordination and movement; no tremor Motor: Normal strength throughout DTRs:  2+ and symmetric in UE without delay in relaxation phase  Mental Status: Judgment, insight: Intact Orientation: Oriented to time, place, and person Memory: Intact for recent and remote events Mood and affect: No depression, anxiety, or agitation     DATA REVIEWED: Results for ILY, DENNO (MRN [id number]) as of 04/23/2019 14:47  Ref. Range 04/22/2019 15:00  TSH Latest Ref Range: 0.35 - 4.50 uIU/mL 3.53  T4,Free(Direct) Latest Ref Range: 0.60 - 1.60 ng/dL 0.86   Thyroid Ultrasound (03/18/2019)  Nodule # 1:  Location: Right; Superior  Maximum size: 1.7 cm; Other 2 dimensions: 1.1 x 0.8 cm  Composition: solid/almost completely solid (2)  Echogenicity: isoechoic (1)  Shape: not taller-than-wide (0)  Margins: ill-defined (0)  Echogenic foci: none (0)  ACR TI-RADS total points: 3.  ACR TI-RADS risk category: TR3 (3 points).  ACR TI-RADS recommendations:  *Given size (>/= 1.5 - 2.4 cm) and appearance, a follow-up ultrasound in 1 year should be considered based on TI-RADS criteria.  _________________________________________________________  Nodule # 2:  Location: Right; Mid  Maximum size: 1.4 cm; Other 2 dimensions: 1.2 x 1 cm  Composition: mixed cystic and solid  (1)  Echogenicity: isoechoic (1)  Shape: not taller-than-wide (0)  Margins: ill-defined (0)  Echogenic foci: none (0)  ACR TI-RADS total points: 2.  ACR TI-RADS risk category: TR2 (2 points).  ACR TI-RADS recommendations:  This nodule does NOT meet TI-RADS criteria for biopsy or dedicated follow-up.  _________________________________________________________  Nodule # 3:  Location: Right; Inferior  Maximum size: 1.8 cm; Other 2 dimensions: 1.2 x 1 cm  Composition: solid/almost completely solid (2)  Echogenicity: hypoechoic (2)  Shape: not taller-than-wide (0)  Margins: ill-defined (0)  Echogenic foci:  ACR TI-RADS total points: 4.  ACR TI-RADS risk category: TR4 (4-6 points).  ACR TI-RADS recommendations:  **Given size (>/= 1.5 cm) and appearance, fine needle aspiration of this moderately suspicious nodule should be considered based on TI-RADS criteria.  _________________________________________________________  Nodule # 4:  Location: Left; Mid  Maximum size: 0.7 cm; Other 2 dimensions: 0.6 x 0.6 cm  Composition: solid/almost completely solid (2)  Echogenicity: hypoechoic (2)  Shape: not taller-than-wide (0)  Margins: ill-defined (0)  Echogenic foci: none (0)  ACR TI-RADS total points: 4.  ACR TI-RADS risk category: TR4 (4-6 points).  ACR TI-RADS recommendations:  Given size (<0.9 cm) and appearance, this nodule does NOT meet TI-RADS criteria for biopsy or dedicated follow-up.  IMPRESSION: 1. Mild thyromegaly with bilateral nodules. 2. In the absence of prior studies documenting long-term stability, recommend FNA biopsy of moderately suspicious 1.8 cm inferior right nodule. 3. Recommend annual/biennial ultrasound follow-up of superior right nodule as above, until stability x5 years confirmed.   Old records , labs and images have been reviewed.    ASSESSMENT/PLAN/RECOMMENDATIONS:   1. Multinodular  Goiter :  - No local neck symptoms - She is clinically and biochemicaly euthyroid - We discussed ultrasound recommendations with proceeding with FNA of the right inferior thyroid nodule with a max diameter of 1.8 cm . We discussed the risk of malignancy is ~ 10 %  - We also discussed the need for a repeat ultrasound in a year of the right superior thyroid nodule . - She understand due to the pandemic there will be a delay in scheduling her FNA  F/u in 1 yr    Signed electronically by: Mack Guise, MD  [facility] Endocrinology  [facility] [address] [place],  [address] Phone: [phone]  FAX: [phone]   CC: Ronnald Nian, DO [place] [address] Phone: [phone] Fax: [phone]   Return to Endocrinology clinic as below: Future Appointments  Date Time Provider [place]  06/06/2019  2:45 PM GI-315 DG C-ARM RM 3 [id number] GI[facility] W. WE  06/06/2019  3:10 PM GI-315 MR 1 [id number] GI-[facility] WE

## 2019-04-22 NOTE — Patient Instructions (Signed)
-   Please stop by the lab today , we will contact you with results - We will set you up for right thyroid nodule biopsy, but this will not be done until this pandemic slows down.

## 2019-04-23 LAB — T4, FREE: Free T4: 0.86 ng/dL (ref 0.60–1.60)

## 2019-04-23 LAB — TSH: TSH: 3.53 u[IU]/mL (ref 0.35–4.50)

## 2019-05-19 ENCOUNTER — Encounter: Payer: Self-pay | Admitting: Family Medicine

## 2019-05-22 ENCOUNTER — Telehealth (INDEPENDENT_AMBULATORY_CARE_PROVIDER_SITE_OTHER): Payer: BLUE CROSS/BLUE SHIELD | Admitting: Family Medicine

## 2019-05-22 ENCOUNTER — Encounter: Payer: Self-pay | Admitting: Family Medicine

## 2019-05-22 DIAGNOSIS — J069 Acute upper respiratory infection, unspecified: Secondary | ICD-10-CM

## 2019-05-22 NOTE — Progress Notes (Signed)
Virtual Visit via Video Note  I connected with Angela [name] on 05/22/19 at  3:30 PM EDT by a video enabled telemedicine application and verified that I am speaking with the correct person using two identifiers. Location patient: home Location provider: work or home office Persons participating in the virtual visit: patient, provider  I discussed the limitations of evaluation and management by telemedicine and the availability of in person appointments. The patient expressed understanding and agreed to proceed.  Chief Complaint  Patient presents with  . Nasal Congestion    right ear discomfort (crackles in ears), having petechiae ( stomach and chest under/upper arms) , no fever, a little bit of body aches, headache coming and going, dry cough worse when laying down/ started 5/14/ hasn't really taken anything OTC     HPI: Angela [name] is a 58 y.o. female complains of 2 week (started 5/14) h/o dry cough, nasal congestion, Rt ear discomfort/fullness, on/off headache, mild body aches, and now rash on her chest, abdomen, and upper arms.  No fever (she has taken temp daily), chills, GI symptoms, no SOB. No significant fatigue.  She has not taken any OTC meds.  She states symptoms were worst last week compared to this week. Overall she feels like she is improving.  She does have a h/o allergies, asthma, migraines, dermatographia.  Past Medical History:  Diagnosis Date  . ADHD   . Allergy   . Asthma   . Cancer ([place])    [place]  . Depression   . Fibromyalgia 1999  . GERD (gastroesophageal reflux disease)   . Hx of skin cancer, basal cell 09/2016   L lower eyelid  . Hyperlipidemia   . Metabolic disorder 6387   leaky gut  . Migraines   . Sleep apnea 2015  . Thyroid disease   . Tinnitus, bilateral   . Urine incontinence     Past Surgical History:  Procedure Laterality Date  . ABDOMINAL HYSTERECTOMY     uterus only (bladder sling)  . left knee repair  10/24/2014  . left  lower eyelid BCC removal    . PRP left knee  04/03/2015  . rectocele removed  01/01/2009  . right knee arthroscopy  01/15/2010  . right shoulder removal of bone spurs bursa clean up  03/06/2015    Family History  Problem Relation Age of Onset  . Arthritis Mother   . Cancer Mother   . Depression Mother   . Hyperlipidemia Mother   . Hypertension Mother   . Mental illness Mother   . Alcohol abuse Father   . Asthma Father   . COPD Father   . Heart attack Father   . Hyperlipidemia Father   . Hypertension Father   . Stroke Father   . Birth defects Brother   . Asthma Son   . Cancer Maternal Grandmother   . Heart disease Maternal Grandmother   . Cancer Maternal Grandfather   . Cancer Paternal Grandmother   . Alcohol abuse Paternal Grandmother   . Alcohol abuse Brother   . Diabetes Brother   . Learning disabilities Brother   . Cancer Brother     Social History   Tobacco Use  . Smoking status: Never Smoker  . Smokeless tobacco: Never Used  Substance Use Topics  . Alcohol use: Yes    Comment: social  . Drug use: Never     Current Outpatient Medications:  .  cyclobenzaprine (FLEXERIL) 5 MG tablet, 1-2 tabs po qHS PRN, Disp: 20 tablet,  Rfl: 0 .  fluticasone (FLONASE) 50 MCG/ACT nasal spray, Place into both nostrils daily., Disp: , Rfl:  .  L-Methylfolate-Algae (DEPLIN 15) 15-90.314 MG CAPS, Take 15 mg by mouth daily., Disp: 90 capsule, Rfl: 3 .  Lifitegrast (XIIDRA) 5 % SOLN, Apply to eye., Disp: , Rfl:  .  lisdexamfetamine (VYVANSE) 30 MG capsule, Take 1 capsule (30 mg total) by mouth daily., Disp: 30 capsule, Rfl: 0 .  omega-3 acid ethyl esters (LOVAZA) 1 g capsule, Take by mouth 2 (two) times daily., Disp: , Rfl:  .  Polyethylene Glycol 3350 (MIRALAX PO), Take by mouth., Disp: , Rfl:  .  pregabalin (LYRICA) 50 MG capsule, Take 1 capsule (50 mg total) by mouth 3 (three) times daily., Disp: 270 capsule, Rfl: 3 .  pregabalin (LYRICA) 50 MG capsule, Take 50 mg by mouth 3  (three) times daily., Disp: , Rfl:  .  rizatriptan (MAXALT) 5 MG tablet, Take 1 tablet (5 mg total) by mouth as needed for migraine. May repeat in 2 hours if needed, Disp: 10 tablet, Rfl: 2 .  valACYclovir (VALTREX) 500 MG tablet, Take 1 tablet (500 mg total) by mouth daily., Disp: 90 tablet, Rfl: 3 .  ZINC PICOLINATE PO, Take 30 mg by mouth daily., Disp: , Rfl:  .  zolpidem (AMBIEN) 5 MG tablet, Take 5 mg by mouth at bedtime as needed for sleep. 1-2/mo, Disp: , Rfl:   Allergies  Allergen Reactions  . Caffeine     Heart palpatations  . Morphine And Related     Hallucinations       ROS: See pertinent positives and negatives per HPI.   EXAM:  VITALS per patient if applicable: There were no vitals taken for this visit.   GENERAL: alert, oriented, appears well and in no acute distress  HEENT: atraumatic, conjunctiva clear, no obvious abnormalities on inspection of external nose and ears  NECK: normal movements of the head and neck  LUNGS: on inspection no signs of respiratory distress, breathing rate appears normal, no obvious gross SOB, gasping or wheezing, no conversational dyspnea  CV: no obvious cyanosis  MS: moves all visible extremities without noticeable abnormality  SKIN: difficult to assess as pt could not focus camera on affected area  PSYCH/NEURO: pleasant and cooperative, speech and thought processing grossly intact   ASSESSMENT AND PLAN: 1. Viral URI - symptoms x 2 weeks and improving overall (pt never had fever and was checking temp daily) - cont with supportive care - f/u PRN    I discussed the assessment and treatment plan with the patient. The patient was provided an opportunity to ask questions and all were answered. The patient agreed with the plan and demonstrated an understanding of the instructions.   The patient was advised to call back or seek an in-person evaluation if the symptoms worsen or if the condition fails to improve as  anticipated.   Letta Median, DO

## 2019-05-29 ENCOUNTER — Ambulatory Visit
Admission: RE | Admit: 2019-05-29 | Discharge: 2019-05-29 | Disposition: A | Discharge status: Home / Self Care | Payer: BC Managed Care – PPO | Source: Ambulatory Visit | Attending: Internal Medicine | Admitting: Internal Medicine

## 2019-05-29 ENCOUNTER — Encounter: Payer: Self-pay | Admitting: Family Medicine

## 2019-05-29 ENCOUNTER — Other Ambulatory Visit (HOSPITAL_COMMUNITY)
Admission: RE | Admit: 2019-05-29 | Discharge: 2019-05-29 | Disposition: A | Discharge status: Home / Self Care | Payer: BC Managed Care – PPO | Source: Ambulatory Visit | Attending: Radiology | Admitting: Radiology

## 2019-05-29 ENCOUNTER — Encounter: Payer: Self-pay | Admitting: *Deleted

## 2019-05-29 DIAGNOSIS — E042 Nontoxic multinodular goiter: Secondary | ICD-10-CM | POA: Diagnosis not present

## 2019-05-29 DIAGNOSIS — E041 Nontoxic single thyroid nodule: Secondary | ICD-10-CM | POA: Diagnosis not present

## 2019-06-03 DIAGNOSIS — Z713 Dietary counseling and surveillance: Secondary | ICD-10-CM | POA: Diagnosis not present

## 2019-06-06 ENCOUNTER — Ambulatory Visit
Admission: RE | Admit: 2019-06-06 | Discharge: 2019-06-06 | Disposition: A | Discharge status: Home / Self Care | Payer: BLUE CROSS/BLUE SHIELD | Source: Ambulatory Visit | Attending: Orthopedic Surgery | Admitting: Orthopedic Surgery

## 2019-06-06 ENCOUNTER — Other Ambulatory Visit: Payer: Self-pay

## 2019-06-06 ENCOUNTER — Ambulatory Visit
Admission: RE | Admit: 2019-06-06 | Discharge: 2019-06-06 | Disposition: A | Discharge status: Home / Self Care | Payer: BC Managed Care – PPO | Source: Ambulatory Visit | Attending: Orthopedic Surgery | Admitting: Orthopedic Surgery

## 2019-06-06 DIAGNOSIS — M7582 Other shoulder lesions, left shoulder: Secondary | ICD-10-CM | POA: Diagnosis not present

## 2019-06-06 DIAGNOSIS — M79602 Pain in left arm: Secondary | ICD-10-CM

## 2019-06-06 DIAGNOSIS — M25512 Pain in left shoulder: Secondary | ICD-10-CM | POA: Diagnosis not present

## 2019-06-06 MED ORDER — IOPAMIDOL (ISOVUE-M 200) INJECTION 41%
15.0000 mL | Freq: Once | INTRAMUSCULAR | Status: AC
  Administered 2019-06-06: 12 mL via INTRA_ARTICULAR

## 2019-06-07 ENCOUNTER — Encounter: Payer: Self-pay | Admitting: Family Medicine

## 2019-06-07 DIAGNOSIS — F988 Other specified behavioral and emotional disorders with onset usually occurring in childhood and adolescence: Secondary | ICD-10-CM

## 2019-06-12 ENCOUNTER — Ambulatory Visit (INDEPENDENT_AMBULATORY_CARE_PROVIDER_SITE_OTHER): Payer: BC Managed Care – PPO | Admitting: Orthopedic Surgery

## 2019-06-12 ENCOUNTER — Other Ambulatory Visit: Payer: Self-pay

## 2019-06-12 ENCOUNTER — Encounter: Payer: Self-pay | Admitting: Orthopedic Surgery

## 2019-06-12 DIAGNOSIS — M7552 Bursitis of left shoulder: Secondary | ICD-10-CM | POA: Diagnosis not present

## 2019-06-12 DIAGNOSIS — Z713 Dietary counseling and surveillance: Secondary | ICD-10-CM | POA: Diagnosis not present

## 2019-06-12 IMAGING — US US THYROID
1 series · 12 of 25 positions shown · non-contrast
Comparison: None available

CLINICAL DATA: Thyroid cysts.

EXAM:
THYROID ULTRASOUND
TECHNIQUE: Ultrasound examination of the thyroid gland and adjacent soft
tissues was performed.

[Series 1: us thyroid · 0.08mm/px · 12 of 55 slices shown]
[im 3/55]
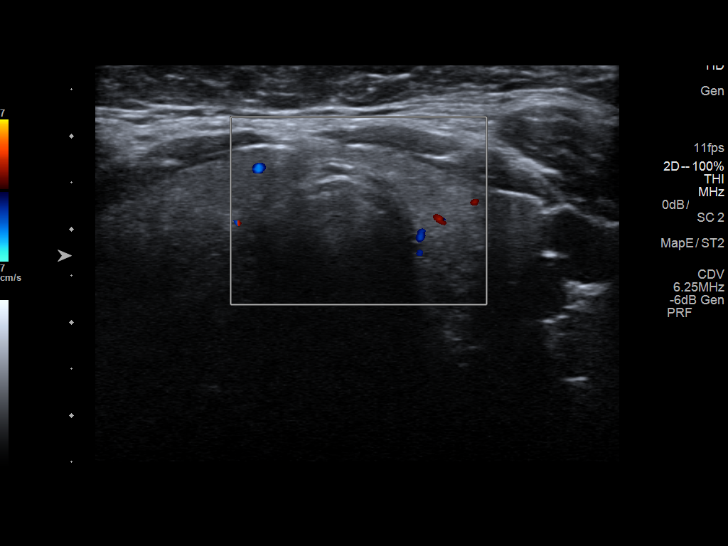
[im 7/55]
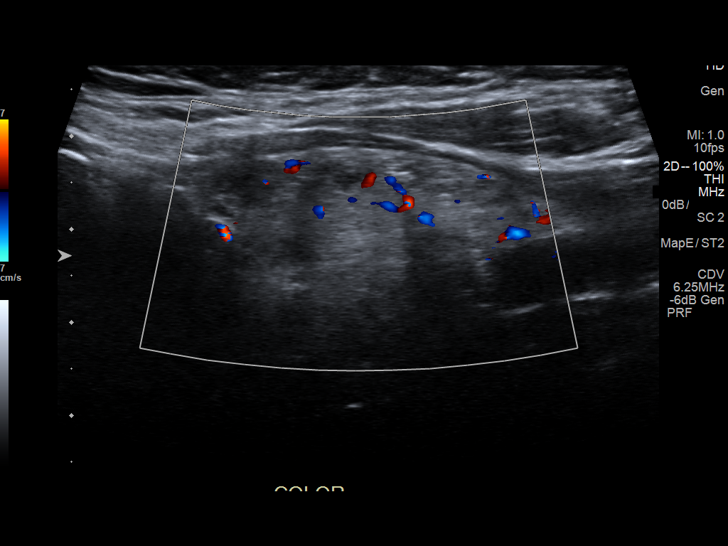
[im 12/55]
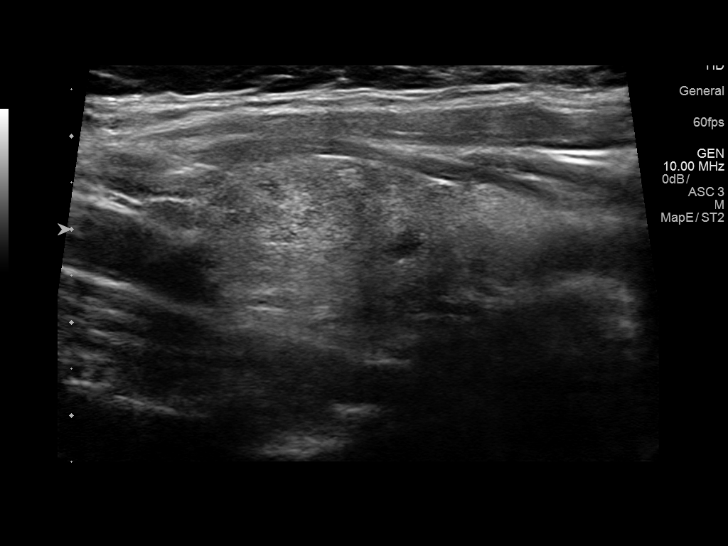
[im 16/55]
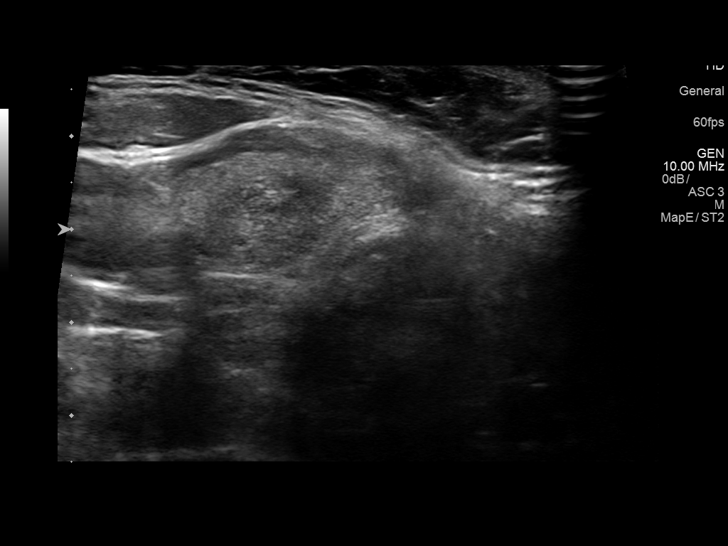
[im 21/55]
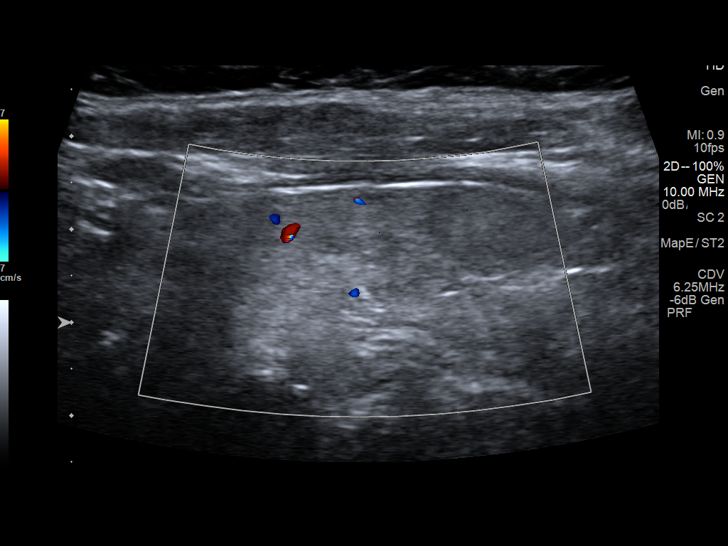
[im 25/55]
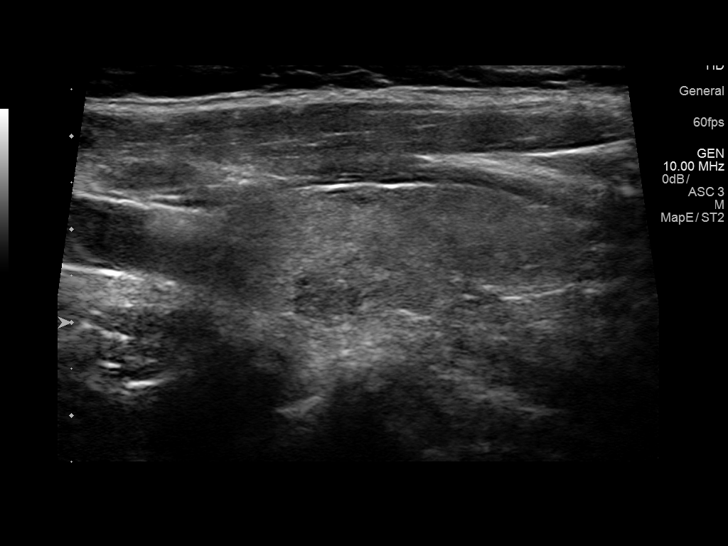
[im 30/55]
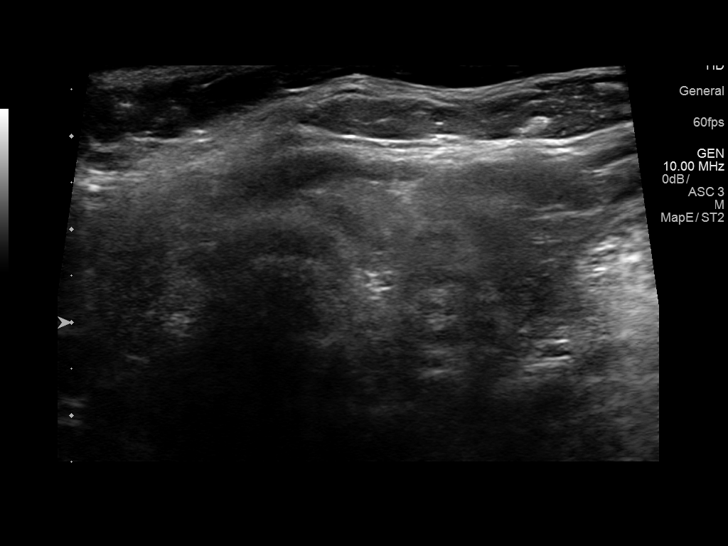
[im 34/55]
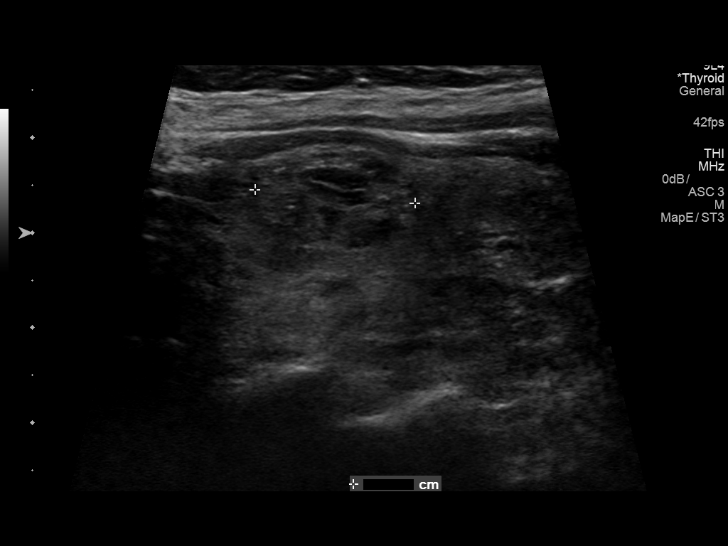
[im 39/55]
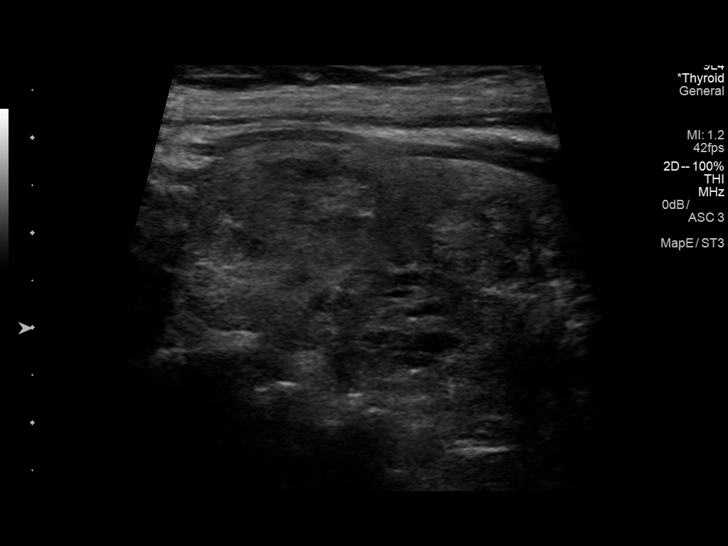
[im 43/55]
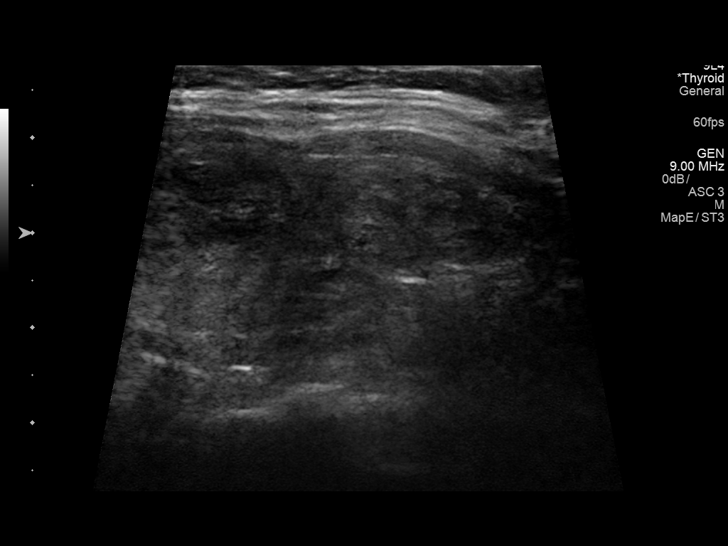
[im 48/55]
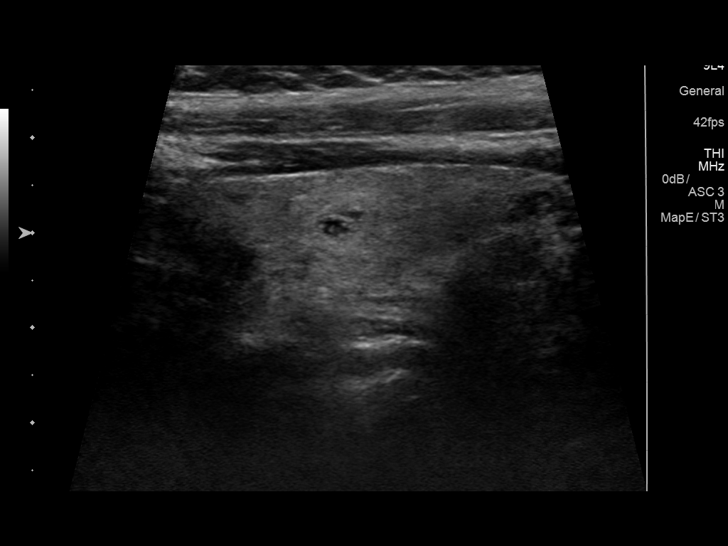
[im 52/55]
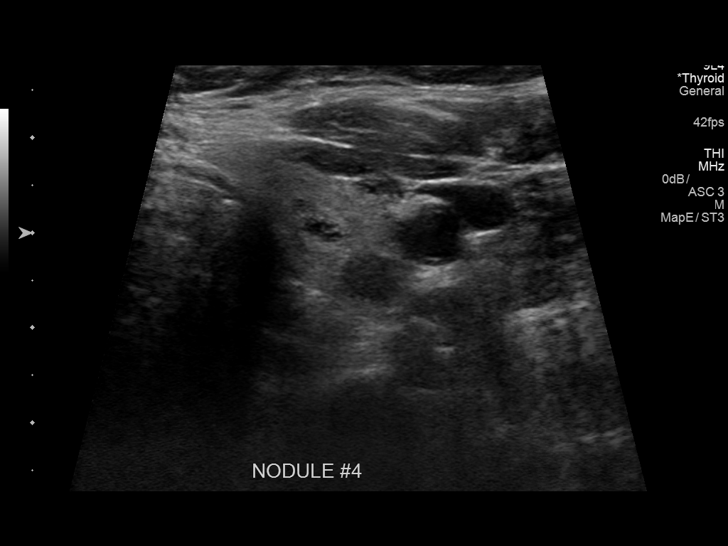

[12 of 25 positions shown; findings below may reference images not displayed]

FINDINGS: Parenchymal Echotexture: Moderately heterogenous

Isthmus: 0.3 cm thickness

Right lobe: 5.7 x 2.4 x 2.2 cm

Left lobe: 4.6 x 1.9 x 1.3 cm

_________________________________________________________

Estimated total number of nodules >/= 1 cm: 3

Number of spongiform nodules >/=  2 cm not described below (TR1): 0

Number of mixed cystic and solid nodules >/= 1.5 cm not described
below (TR2): 0

_________________________________________________________

Nodule # 1:

Location: Right; Superior

Maximum size: 1.7 cm; Other 2 dimensions: 1.1 x 0.8 cm

Composition: solid/almost completely solid (2)

Echogenicity: isoechoic (1)

Shape: not taller-than-wide (0)

Margins: ill-defined (0)

Echogenic foci: none (0)

ACR TI-RADS total points: 3.

ACR TI-RADS risk category: TR3 (3 points).

ACR TI-RADS recommendations:

*Given size (>/= 1.5 - 2.4 cm) and appearance, a follow-up
ultrasound in 1 year should be considered based on TI-RADS criteria.

_________________________________________________________

Nodule # 2:

Location: Right; Mid

Maximum size: 1.4 cm; Other 2 dimensions: 1.2 x 1 cm

Composition: mixed cystic and solid (1)

Echogenicity: isoechoic (1)

Shape: not taller-than-wide (0)

Margins: ill-defined (0)

Echogenic foci: none (0)

ACR TI-RADS total points: 2.

ACR TI-RADS risk category: TR2 (2 points).

ACR TI-RADS recommendations:

This nodule does NOT meet TI-RADS criteria for biopsy or dedicated
follow-up.

_________________________________________________________

Nodule # 3:

Location: Right; Inferior

Maximum size: 1.8 cm; Other 2 dimensions: 1.2 x 1 cm

Composition: solid/almost completely solid (2)

Echogenicity: hypoechoic (2)

Shape: not taller-than-wide (0)

Margins: ill-defined (0)

Echogenic foci:

ACR TI-RADS total points: 4.

ACR TI-RADS risk category: TR4 (4-6 points).

ACR TI-RADS recommendations:

**Given size (>/= 1.5 cm) and appearance, fine needle aspiration of
this moderately suspicious nodule should be considered based on
TI-RADS criteria.

_________________________________________________________

Nodule # 4:

Location: Left; Mid

Maximum size: 0.7 cm; Other 2 dimensions: 0.6 x 0.6 cm

Composition: solid/almost completely solid (2)

Echogenicity: hypoechoic (2)

Shape: not taller-than-wide (0)

Margins: ill-defined (0)

Echogenic foci: none (0)

ACR TI-RADS total points: 4.

ACR TI-RADS risk category: TR4 (4-6 points).

ACR TI-RADS recommendations:

Given size (<0.9 cm) and appearance, this nodule does NOT meet
TI-RADS criteria for biopsy or dedicated follow-up.
IMPRESSION: 1. Mild thyromegaly with bilateral nodules.
2. In the absence of prior studies documenting long-term stability,
recommend FNA biopsy of moderately suspicious 1.8 cm inferior right
nodule.
3. Recommend annual/biennial ultrasound follow-up of superior right
nodule as above, until stability x5 years confirmed.

The above is in keeping with the ACR TI-RADS recommendations - [HOSPITAL] 9344;[DATE].

## 2019-06-12 MED ORDER — LISDEXAMFETAMINE DIMESYLATE 30 MG PO CAPS: 30.0000 mg | ORAL_CAPSULE | Freq: Every day | ORAL | 0 refills | Status: DC

## 2019-06-12 NOTE — Progress Notes (Signed)
Office Visit Note   Patient: Angela [name]           Date of Birth: 1961-06-04           MRN: [id number] Visit Date: 06/12/2019 Requested by: Ronnald Nian, DO [place],  [place] [address] PCP: Ronnald Nian, DO  Subjective: Chief Complaint  Patient presents with  . Follow-up    HPI: Angela [name] is a 58 year old patient with left shoulder pain.  Since have seen her she is had an MRI scan.  Patient has predominantly tendinitis and bursitis.  Partial-thickness cuff tearing of the supraspinatus.  Overhead motion hurts her worse.  It is waking her up from sleep.  She does have a history of right shoulder subacromial decompression which she did very well from.  She has had physical therapy on 3 occasions.              ROS: All systems reviewed are negative as they relate to the chief complaint within the history of present illness.  Patient denies  fevers or chills.   Assessment & Plan: Visit Diagnoses:  1. Bursitis of left shoulder     Plan: Impression is left shoulder bursitis with failure of conservative treatment and history of right shoulder surgery which she did well from.  I think it would be reasonable at this time to proceed with left shoulder arthroscopy subacromial decompression and arthroscopic acromioplasty.  Risk and benefits are discussed including but not limited to infection nerve vessel damage incomplete healing shoulder stiffness.  Patient understands the risk and benefits and wishes to proceed.  Anticipate about a 4 to 6-week recovery time.  All questions answered  Follow-Up Instructions: No follow-ups on file.   Orders:  No orders of the defined types were placed in this encounter.  No orders of the defined types were placed in this encounter.     Procedures: No procedures performed   Clinical Data: No additional findings.  Objective: Vital Signs: There were no vitals taken for this visit.  Physical Exam:    Constitutional: Patient appears well-developed HEENT:  Head: Normocephalic Eyes:EOM are normal Neck: Normal range of motion Cardiovascular: Normal rate Pulmonary/chest: Effort normal Neurologic: Patient is alert Skin: Skin is warm Psychiatric: Patient has normal mood and affect    Ortho Exam: Ortho exam demonstrates positive impingement signs on the left negative on the right.  No real AC joint tenderness to direct palpation.  Cuff strength is good symmetrically.  No restriction of passive range of motion on that left shoulder compared to the right.  Specialty Comments:  No specialty comments available.  Imaging: No results found.   PMFS History: Patient Active Problem List   Diagnosis Date Noted  . Impaired fasting glucose 03/06/2019  . History of basal cell carcinoma (BCC) of eyelid 02/27/2019  . Fibromyalgia 02/27/2019  . 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 MTHFR mutation ([place]) 02/27/2019  . Family history of colon cancer in mother 02/27/2019  . Metabolic syndrome 09/32/3557   Past Medical History:  Diagnosis Date  . ADHD   . Allergy   . Asthma   . Cancer ([place])    [place]  . Depression   . Fibromyalgia 1999  . GERD (gastroesophageal reflux disease)   . Hx of skin cancer, basal cell 09/2016   L lower eyelid  . Hyperlipidemia   . Metabolic disorder 3220   leaky gut  . Migraines   . Sleep apnea 2015  . Thyroid  disease   . Tinnitus, bilateral   . Urine incontinence     Family History  Problem Relation Age of Onset  . Arthritis Mother   . Cancer Mother   . Depression Mother   . Hyperlipidemia Mother   . Hypertension Mother   . Mental illness Mother   . Alcohol abuse Father   . Asthma Father   . COPD Father   . Heart attack Father   . Hyperlipidemia Father   . Hypertension Father   . Stroke Father   . Birth defects Brother   . Asthma Son   . Cancer Maternal Grandmother   . Heart disease Maternal Grandmother   . Cancer Maternal  Grandfather   . Cancer Paternal Grandmother   . Alcohol abuse Paternal Grandmother   . Alcohol abuse Brother   . Diabetes Brother   . Learning disabilities Brother   . Cancer Brother     Past Surgical History:  Procedure Laterality Date  . ABDOMINAL HYSTERECTOMY     uterus only (bladder sling)  . left knee repair  10/24/2014  . left lower eyelid BCC removal    . PRP left knee  04/03/2015  . rectocele removed  01/01/2009  . right knee arthroscopy  01/15/2010  . right shoulder removal of bone spurs bursa clean up  03/06/2015   Social History   Occupational History  . Not on file  Tobacco Use  . Smoking status: Never Smoker  . Smokeless tobacco: Never Used  Substance and Sexual Activity  . Alcohol use: Yes    Comment: social  . Drug use: Never  . Sexual activity: Not on file

## 2019-06-14 DIAGNOSIS — H04123 Dry eye syndrome of bilateral lacrimal glands: Secondary | ICD-10-CM | POA: Diagnosis not present

## 2019-06-14 DIAGNOSIS — H16223 Keratoconjunctivitis sicca, not specified as Sjogren's, bilateral: Secondary | ICD-10-CM | POA: Diagnosis not present

## 2019-06-14 DIAGNOSIS — G245 Blepharospasm: Secondary | ICD-10-CM | POA: Diagnosis not present

## 2019-07-15 ENCOUNTER — Encounter: Payer: Self-pay | Admitting: Orthopedic Surgery

## 2019-07-15 DIAGNOSIS — M7552 Bursitis of left shoulder: Secondary | ICD-10-CM | POA: Diagnosis not present

## 2019-07-15 DIAGNOSIS — G8918 Other acute postprocedural pain: Secondary | ICD-10-CM | POA: Diagnosis not present

## 2019-07-22 ENCOUNTER — Ambulatory Visit (INDEPENDENT_AMBULATORY_CARE_PROVIDER_SITE_OTHER): Payer: BC Managed Care – PPO | Admitting: Orthopedic Surgery

## 2019-07-22 ENCOUNTER — Other Ambulatory Visit: Payer: Self-pay

## 2019-07-22 ENCOUNTER — Encounter: Payer: Self-pay | Admitting: Orthopedic Surgery

## 2019-07-22 DIAGNOSIS — M7552 Bursitis of left shoulder: Secondary | ICD-10-CM

## 2019-07-22 NOTE — Progress Notes (Signed)
Post-Op Visit Note   Patient: Angela [name]           Date of Birth: 10-Jul-1961           MRN: [id number] Visit Date: 07/22/2019 PCP: Ronnald Nian, DO   Assessment & Plan:  Chief Complaint:  Chief Complaint  Patient presents with  . Left Shoulder - Routine Post Op   Visit Diagnoses:  1. Bursitis of left shoulder     Plan: Patient is now a week out left shoulder arthroscopy.  She has been doing reasonably well.  She is been moving her shoulder.  She is out of the sling.  On exam the incisions are intact.  Sutures removed.  Plan at this time is start physical therapy and limit lifting to less than or equal to 5 pounds.  Come back in 4 weeks for clinical recheck.  Okay to start doing strengthening as well.  Follow-Up Instructions: Return in about 4 months (around 11/22/2019).   Orders:  No orders of the defined types were placed in this encounter.  No orders of the defined types were placed in this encounter.   Imaging: No results found.  PMFS History: Patient Active Problem List   Diagnosis Date Noted  . Impaired fasting glucose 03/06/2019  . History of basal cell carcinoma (BCC) of eyelid 02/27/2019  . Fibromyalgia 02/27/2019  . 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 MTHFR mutation ([place]) 02/27/2019  . Family history of colon cancer in mother 02/27/2019  . Metabolic syndrome 28/41/3244   Past Medical History:  Diagnosis Date  . ADHD   . Allergy   . Asthma   . Cancer ([place])    [place]  . Depression   . Fibromyalgia 1999  . GERD (gastroesophageal reflux disease)   . Hx of skin cancer, basal cell 09/2016   L lower eyelid  . Hyperlipidemia   . Metabolic disorder 0102   leaky gut  . Migraines   . Sleep apnea 2015  . Thyroid disease   . Tinnitus, bilateral   . Urine incontinence     Family History  Problem Relation Age of Onset  . Arthritis Mother   . Cancer Mother   . Depression Mother   . Hyperlipidemia Mother   . Hypertension  Mother   . Mental illness Mother   . Alcohol abuse Father   . Asthma Father   . COPD Father   . Heart attack Father   . Hyperlipidemia Father   . Hypertension Father   . Stroke Father   . Birth defects Brother   . Asthma Son   . Cancer Maternal Grandmother   . Heart disease Maternal Grandmother   . Cancer Maternal Grandfather   . Cancer Paternal Grandmother   . Alcohol abuse Paternal Grandmother   . Alcohol abuse Brother   . Diabetes Brother   . Learning disabilities Brother   . Cancer Brother     Past Surgical History:  Procedure Laterality Date  . ABDOMINAL HYSTERECTOMY     uterus only (bladder sling)  . left knee repair  10/24/2014  . left lower eyelid BCC removal    . PRP left knee  04/03/2015  . rectocele removed  01/01/2009  . right knee arthroscopy  01/15/2010  . right shoulder removal of bone spurs bursa clean up  03/06/2015   Social History   Occupational History  . Not on file  Tobacco Use  . Smoking status: Never Smoker  . Smokeless tobacco:  Never Used  Substance and Sexual Activity  . Alcohol use: Yes    Comment: social  . Drug use: Never  . Sexual activity: Not on file

## 2019-07-23 DIAGNOSIS — M25519 Pain in unspecified shoulder: Secondary | ICD-10-CM | POA: Diagnosis not present

## 2019-07-24 ENCOUNTER — Inpatient Hospital Stay: Payer: BC Managed Care – PPO | Admitting: Orthopedic Surgery

## 2019-07-25 DIAGNOSIS — M25519 Pain in unspecified shoulder: Secondary | ICD-10-CM | POA: Diagnosis not present

## 2019-07-29 ENCOUNTER — Telehealth: Payer: Self-pay | Admitting: Orthopedic Surgery

## 2019-07-29 DIAGNOSIS — M7502 Adhesive capsulitis of left shoulder: Secondary | ICD-10-CM | POA: Diagnosis not present

## 2019-07-29 DIAGNOSIS — M25512 Pain in left shoulder: Secondary | ICD-10-CM | POA: Diagnosis not present

## 2019-07-29 NOTE — Telephone Encounter (Signed)
Patient called to request an RX refill on her Oxycodone and also the Methocarbamol.  Patient uses [facility] on [address].  CB#[phone].  Thank you.

## 2019-07-29 NOTE — Telephone Encounter (Signed)
Please advise. Thanks.  

## 2019-07-29 NOTE — Telephone Encounter (Signed)
Okay to refill please call thanks

## 2019-07-30 ENCOUNTER — Other Ambulatory Visit: Payer: Self-pay | Admitting: Surgical

## 2019-07-30 MED ORDER — METHOCARBAMOL 500 MG PO TABS: 500.0000 mg | ORAL_TABLET | Freq: Two times a day (BID) | ORAL | 0 refills | Status: DC

## 2019-07-30 MED ORDER — OXYCODONE HCL 5 MG PO TABS: 5.0000 mg | ORAL_TABLET | Freq: Three times a day (TID) | ORAL | 0 refills | Status: DC

## 2019-07-30 NOTE — Telephone Encounter (Signed)
Can you please submit electronically.  

## 2019-07-31 ENCOUNTER — Encounter: Payer: Self-pay | Admitting: Family Medicine

## 2019-07-31 ENCOUNTER — Other Ambulatory Visit: Payer: Self-pay

## 2019-07-31 ENCOUNTER — Other Ambulatory Visit: Payer: Self-pay | Admitting: Family Medicine

## 2019-07-31 DIAGNOSIS — R059 Cough, unspecified: Secondary | ICD-10-CM

## 2019-07-31 DIAGNOSIS — R432 Parageusia: Secondary | ICD-10-CM

## 2019-07-31 DIAGNOSIS — R05 Cough: Secondary | ICD-10-CM

## 2019-07-31 DIAGNOSIS — Z20822 Contact with and (suspected) exposure to covid-19: Secondary | ICD-10-CM

## 2019-07-31 DIAGNOSIS — R0981 Nasal congestion: Secondary | ICD-10-CM

## 2019-08-01 LAB — NOVEL CORONAVIRUS, NAA: SARS-CoV-2, NAA: NOT DETECTED

## 2019-08-05 DIAGNOSIS — M7502 Adhesive capsulitis of left shoulder: Secondary | ICD-10-CM | POA: Diagnosis not present

## 2019-08-05 DIAGNOSIS — M25512 Pain in left shoulder: Secondary | ICD-10-CM | POA: Diagnosis not present

## 2019-08-07 DIAGNOSIS — M25512 Pain in left shoulder: Secondary | ICD-10-CM | POA: Diagnosis not present

## 2019-08-07 DIAGNOSIS — M7502 Adhesive capsulitis of left shoulder: Secondary | ICD-10-CM | POA: Diagnosis not present

## 2019-08-13 DIAGNOSIS — M25512 Pain in left shoulder: Secondary | ICD-10-CM | POA: Diagnosis not present

## 2019-08-13 DIAGNOSIS — M7502 Adhesive capsulitis of left shoulder: Secondary | ICD-10-CM | POA: Diagnosis not present

## 2019-08-15 DIAGNOSIS — M25512 Pain in left shoulder: Secondary | ICD-10-CM | POA: Diagnosis not present

## 2019-08-15 DIAGNOSIS — M7502 Adhesive capsulitis of left shoulder: Secondary | ICD-10-CM | POA: Diagnosis not present

## 2019-08-20 ENCOUNTER — Encounter: Payer: Self-pay | Admitting: Family Medicine

## 2019-08-20 DIAGNOSIS — M7502 Adhesive capsulitis of left shoulder: Secondary | ICD-10-CM | POA: Diagnosis not present

## 2019-08-20 DIAGNOSIS — M25512 Pain in left shoulder: Secondary | ICD-10-CM | POA: Diagnosis not present

## 2019-08-21 ENCOUNTER — Ambulatory Visit (INDEPENDENT_AMBULATORY_CARE_PROVIDER_SITE_OTHER): Payer: BC Managed Care – PPO | Admitting: Orthopedic Surgery

## 2019-08-21 ENCOUNTER — Encounter: Payer: Self-pay | Admitting: Orthopedic Surgery

## 2019-08-21 VITALS — Ht 63.0 in | Wt 197.4 lb

## 2019-08-21 DIAGNOSIS — M7552 Bursitis of left shoulder: Secondary | ICD-10-CM

## 2019-08-21 MED ORDER — TRAMADOL HCL 50 MG PO TABS: ORAL_TABLET | ORAL | 0 refills | Status: DC

## 2019-08-22 ENCOUNTER — Encounter: Payer: Self-pay | Admitting: Orthopedic Surgery

## 2019-08-22 NOTE — Progress Notes (Signed)
Post-Op Visit Note   Patient: Angela [name]           Date of Birth: 09-14-1961           MRN: [id number] Visit Date: 08/21/2019 PCP: Ronnald Nian, DO   Assessment & Plan:  Chief Complaint:  Chief Complaint  Patient presents with  . Left Shoulder - Follow-up   Visit Diagnoses:  1. Bursitis of left shoulder     Plan: Angela [name] is now 5 weeks out left shoulder arthroscopy subacromial decompression.  She is doing better in therapy.  Overall she is better but still has some occasional aches and pains.  She does not want to take any anti-inflammatories.  She is doing physical therapy as well as a home exercise program.  On examination she has excellent range of motion and mild anterior shoulder pain.  I will try her with tramadol to take at night and continue physical therapy 1 time a week for 6 weeks.  Come back in 6 weeks for final check.  Follow-Up Instructions: Return in about 6 weeks (around 10/02/2019).   Orders:  Orders Placed This Encounter  Procedures  . Ambulatory referral to Physical Therapy   Meds ordered this encounter  Medications  . traMADol (ULTRAM) 50 MG tablet    Sig: Take one tablet every 8-12 hours as needed for pain.    Dispense:  30 tablet    Refill:  0    Imaging: No results found.  PMFS History: Patient Active Problem List   Diagnosis Date Noted  . Impaired fasting glucose 03/06/2019  . History of basal cell carcinoma (BCC) of eyelid 02/27/2019  . Fibromyalgia 02/27/2019  . 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 MTHFR mutation ([place]) 02/27/2019  . Family history of colon cancer in mother 02/27/2019  . Metabolic syndrome [id number]   Past Medical History:  Diagnosis Date  . ADHD   . Allergy   . Asthma   . Cancer ([place])    [place]  . Depression   . Fibromyalgia 1999  . GERD (gastroesophageal reflux disease)   . Hx of skin cancer, basal cell 09/2016   L lower eyelid  . Hyperlipidemia   . Metabolic disorder 123456   leaky  gut  . Migraines   . Sleep apnea 2015  . Thyroid disease   . Tinnitus, bilateral   . Urine incontinence     Family History  Problem Relation Age of Onset  . Arthritis Mother   . Cancer Mother   . Depression Mother   . Hyperlipidemia Mother   . Hypertension Mother   . Mental illness Mother   . Alcohol abuse Father   . Asthma Father   . COPD Father   . Heart attack Father   . Hyperlipidemia Father   . Hypertension Father   . Stroke Father   . Birth defects Brother   . Asthma Son   . Cancer Maternal Grandmother   . Heart disease Maternal Grandmother   . Cancer Maternal Grandfather   . Cancer Paternal Grandmother   . Alcohol abuse Paternal Grandmother   . Alcohol abuse Brother   . Diabetes Brother   . Learning disabilities Brother   . Cancer Brother     Past Surgical History:  Procedure Laterality Date  . ABDOMINAL HYSTERECTOMY     uterus only (bladder sling)  . left knee repair  10/24/2014  . left lower eyelid BCC removal    . PRP left knee  04/03/2015  .  rectocele removed  01/01/2009  . right knee arthroscopy  01/15/2010  . right shoulder removal of bone spurs bursa clean up  03/06/2015   Social History   Occupational History  . Not on file  Tobacco Use  . Smoking status: Never Smoker  . Smokeless tobacco: Never Used  Substance and Sexual Activity  . Alcohol use: Yes    Comment: social  . Drug use: Never  . Sexual activity: Not on file

## 2019-08-29 DIAGNOSIS — M25512 Pain in left shoulder: Secondary | ICD-10-CM | POA: Diagnosis not present

## 2019-08-29 DIAGNOSIS — M7502 Adhesive capsulitis of left shoulder: Secondary | ICD-10-CM | POA: Diagnosis not present

## 2019-09-05 DIAGNOSIS — M25512 Pain in left shoulder: Secondary | ICD-10-CM | POA: Diagnosis not present

## 2019-09-05 DIAGNOSIS — M7502 Adhesive capsulitis of left shoulder: Secondary | ICD-10-CM | POA: Diagnosis not present

## 2019-09-13 ENCOUNTER — Encounter: Payer: Self-pay | Admitting: Family Medicine

## 2019-09-13 DIAGNOSIS — G2581 Restless legs syndrome: Secondary | ICD-10-CM

## 2019-09-13 DIAGNOSIS — F988 Other specified behavioral and emotional disorders with onset usually occurring in childhood and adolescence: Secondary | ICD-10-CM

## 2019-09-19 DIAGNOSIS — M25512 Pain in left shoulder: Secondary | ICD-10-CM | POA: Diagnosis not present

## 2019-09-19 DIAGNOSIS — M7502 Adhesive capsulitis of left shoulder: Secondary | ICD-10-CM | POA: Diagnosis not present

## 2019-09-19 MED ORDER — LISDEXAMFETAMINE DIMESYLATE 30 MG PO CAPS: 30.0000 mg | ORAL_CAPSULE | Freq: Every day | ORAL | 0 refills | Status: DC

## 2019-09-19 MED ORDER — PRAMIPEXOLE DIHYDROCHLORIDE 0.125 MG PO TABS: ORAL_TABLET | ORAL | 0 refills | Status: DC

## 2019-09-26 DIAGNOSIS — M25512 Pain in left shoulder: Secondary | ICD-10-CM | POA: Diagnosis not present

## 2019-09-26 DIAGNOSIS — M7502 Adhesive capsulitis of left shoulder: Secondary | ICD-10-CM | POA: Diagnosis not present

## 2019-09-30 ENCOUNTER — Encounter: Payer: Self-pay | Admitting: Family Medicine

## 2019-09-30 ENCOUNTER — Telehealth: Payer: Self-pay

## 2019-09-30 NOTE — Telephone Encounter (Signed)
Pt is coming in Thursday afternoon for flu shot and would also like to have Hep C screening drawn. Okay to place order?

## 2019-10-02 ENCOUNTER — Telehealth: Payer: Self-pay | Admitting: Family Medicine

## 2019-10-02 ENCOUNTER — Telehealth: Payer: Self-pay

## 2019-10-02 ENCOUNTER — Ambulatory Visit: Payer: BC Managed Care – PPO | Admitting: Orthopedic Surgery

## 2019-10-02 NOTE — Telephone Encounter (Signed)

## 2019-10-02 NOTE — Telephone Encounter (Signed)

## 2019-10-03 ENCOUNTER — Other Ambulatory Visit (INDEPENDENT_AMBULATORY_CARE_PROVIDER_SITE_OTHER): Payer: BC Managed Care – PPO

## 2019-10-03 ENCOUNTER — Ambulatory Visit (INDEPENDENT_AMBULATORY_CARE_PROVIDER_SITE_OTHER): Payer: BC Managed Care – PPO

## 2019-10-03 ENCOUNTER — Other Ambulatory Visit: Payer: Self-pay | Admitting: Family Medicine

## 2019-10-03 ENCOUNTER — Other Ambulatory Visit: Payer: Self-pay

## 2019-10-03 ENCOUNTER — Encounter: Payer: Self-pay | Admitting: Family Medicine

## 2019-10-03 DIAGNOSIS — Z1159 Encounter for screening for other viral diseases: Secondary | ICD-10-CM

## 2019-10-03 DIAGNOSIS — Z23 Encounter for immunization: Secondary | ICD-10-CM

## 2019-10-03 NOTE — Progress Notes (Signed)
Pt came into the office today for flu shot, gave injection to the right arm, pt tolerated injection well.

## 2019-10-03 NOTE — Telephone Encounter (Signed)
Lab order placed. Thank you

## 2019-10-04 LAB — HEPATITIS C ANTIBODY
Hepatitis C Ab: NONREACTIVE
SIGNAL TO CUT-OFF: 0.05 (ref <1.00)

## 2019-10-07 ENCOUNTER — Telehealth: Payer: Self-pay

## 2019-10-07 ENCOUNTER — Telehealth: Payer: BC Managed Care – PPO | Admitting: Physician Assistant

## 2019-10-07 ENCOUNTER — Encounter (INDEPENDENT_AMBULATORY_CARE_PROVIDER_SITE_OTHER): Payer: Self-pay

## 2019-10-07 ENCOUNTER — Encounter: Payer: Self-pay | Admitting: Family Medicine

## 2019-10-07 ENCOUNTER — Other Ambulatory Visit: Payer: Self-pay | Admitting: *Deleted

## 2019-10-07 DIAGNOSIS — R05 Cough: Secondary | ICD-10-CM

## 2019-10-07 DIAGNOSIS — R059 Cough, unspecified: Secondary | ICD-10-CM

## 2019-10-07 DIAGNOSIS — Z20828 Contact with and (suspected) exposure to other viral communicable diseases: Secondary | ICD-10-CM

## 2019-10-07 DIAGNOSIS — Z20822 Contact with and (suspected) exposure to covid-19: Secondary | ICD-10-CM

## 2019-10-07 MED ORDER — BENZONATATE 100 MG PO CAPS: 100.0000 mg | ORAL_CAPSULE | Freq: Three times a day (TID) | ORAL | 0 refills | Status: DC | PRN

## 2019-10-07 NOTE — Telephone Encounter (Signed)
I would recommend waiting at least 3-5 days after exposure to lessen the chance of a false negative test. She does not need an order for testing, she can go to testing center per information sent to her from [facility].

## 2019-10-07 NOTE — Progress Notes (Signed)
E-Visit for Corona Virus Screening   Your current symptoms could be consistent with the coronavirus.  Many health care providers can now test patients at their office but not all are.  Angela [name] has multiple testing sites - you do not need an appointment or a lab order. For information on our COVID testing locations and hours go to [email or web address]  Please quarantine yourself while awaiting your test results.  We are enrolling you in our [facility] [place] . Daily you will receive a questionnaire within the [facility] website. Our COVID 19 response team willl be monitoriing your responses daily.    COVID-19 is a respiratory illness with symptoms that are similar to the flu. Symptoms are typically mild to moderate, but there have been cases of severe illness and death due to the virus. The following symptoms may appear 2-14 days after exposure: . Fever . Cough . Shortness of breath or difficulty breathing . Chills . Repeated shaking with chills . Muscle pain . Headache . Sore throat . New loss of taste or smell . Fatigue . Congestion or runny nose . Nausea or vomiting . Diarrhea  It is vitally important that if you feel that you have an infection such as this virus or any other virus that you stay home and away from places where you may spread it to others.  You should self-quarantine for 14 days if you have symptoms that could potentially be coronavirus or have been in close contact a with a person diagnosed with COVID-19 within the last 2 weeks. You should avoid contact with people age 60 and older.   You should wear a mask or cloth face covering over your nose and mouth if you must be around other people or animals, including pets (even at home). Try to stay at least 6 feet away from other people. This will protect the people around you.  You can use medication such as A prescription cough medication called Tessalon Perles 100 mg. You may  take 1-2 capsules every 8 hours as needed for cough  You may also take acetaminophen (Tylenol) as needed for fever.   Reduce your risk of any infection by using the same precautions used for avoiding the common cold or flu:  Marland Kitchen Wash your hands often with soap and warm water for at least 20 seconds.  If soap and water are not readily available, use an alcohol-based hand sanitizer with at least 60% alcohol.  . If coughing or sneezing, cover your mouth and nose by coughing or sneezing into the elbow areas of your shirt or coat, into a tissue or into your sleeve (not your hands). . Avoid shaking hands with others and consider head nods or verbal greetings only. . Avoid touching your eyes, nose, or mouth with unwashed hands.  . Avoid close contact with people who are sick. . Avoid places or events with large numbers of people in one location, like concerts or sporting events. . Carefully consider travel plans you have or are making. . If you are planning any travel outside or inside the [name], visit the CDC's Travelers' Health webpage for the latest health notices. . If you have some symptoms but not all symptoms, continue to monitor at home and seek medical attention if your symptoms worsen. . If you are having a medical emergency, call 911.  HOME CARE . Only take medications as instructed by your medical team. . Drink plenty of fluids and get plenty of rest. . A steam  or ultrasonic humidifier can help if you have congestion.   GET HELP RIGHT AWAY IF YOU HAVE EMERGENCY WARNING SIGNS** FOR COVID-19. If you or someone is showing any of these signs seek emergency medical care immediately. Call 911 or proceed to your closest emergency facility if: . You develop worsening high fever. . Trouble breathing . Bluish lips or face . Persistent pain or pressure in the chest . New confusion . Inability to wake or stay awake . You cough up blood. . Your symptoms become more severe  **This list is not all  possible symptoms. Contact your medical provider for any symptoms that are sever or concerning to you.   MAKE SURE YOU   Understand these instructions.  Will watch your condition.  Will get help right away if you are not doing well or get worse.  Your e-visit answers were reviewed by a board certified advanced clinical practitioner to complete your personal care plan.  Depending on the condition, your plan could have included both over the counter or prescription medications.  If there is a problem please reply once you have received a response from your provider.  Your safety is important to [name].  If you have drug allergies check your prescription carefully.    You can use MyChart to ask questions about today's visit, request a non-urgent call back, or ask for a work or school excuse for 24 hours related to this e-Visit. If it has been greater than 24 hours you will need to follow up with your provider, or enter a new e-Visit to address those concerns. You will get an e-mail in the next two days asking about your experience.  I hope that your e-visit has been valuable and will speed your recovery. Thank you for using e-visits.   Greater than 5 minutes, yet less than 10 minutes of time have been spent researching, coordinating and implementing care for this patient today.

## 2019-10-07 NOTE — Telephone Encounter (Signed)
Left a vm for patient to callback regarding questionnaire. 

## 2019-10-08 ENCOUNTER — Encounter (INDEPENDENT_AMBULATORY_CARE_PROVIDER_SITE_OTHER): Payer: Self-pay

## 2019-10-08 ENCOUNTER — Encounter: Payer: Self-pay | Admitting: Family Medicine

## 2019-10-08 LAB — NOVEL CORONAVIRUS, NAA: SARS-CoV-2, NAA: NOT DETECTED

## 2019-10-09 ENCOUNTER — Ambulatory Visit: Payer: BC Managed Care – PPO | Admitting: Orthopedic Surgery

## 2019-10-14 ENCOUNTER — Encounter (INDEPENDENT_AMBULATORY_CARE_PROVIDER_SITE_OTHER): Payer: Self-pay

## 2019-10-17 ENCOUNTER — Encounter (INDEPENDENT_AMBULATORY_CARE_PROVIDER_SITE_OTHER): Payer: Self-pay

## 2019-10-18 ENCOUNTER — Encounter (INDEPENDENT_AMBULATORY_CARE_PROVIDER_SITE_OTHER): Payer: Self-pay

## 2019-10-18 ENCOUNTER — Encounter: Payer: Self-pay | Admitting: Family Medicine

## 2019-10-20 ENCOUNTER — Telehealth: Payer: Self-pay

## 2019-10-20 ENCOUNTER — Encounter (INDEPENDENT_AMBULATORY_CARE_PROVIDER_SITE_OTHER): Payer: Self-pay

## 2019-10-20 NOTE — Telephone Encounter (Signed)
Called and left message on VM to call back. 

## 2019-10-20 NOTE — Telephone Encounter (Signed)
Advised pt to take OTC medication/cough drops for cough. Pt advised to take 2 tsp of honey to help with cough at night. Advised to that is cough is not better or worsens, if cough is keeping her up at night, or if coughing up yellow or green phlegm to contact her PCP. Pt verbalized understanding.

## 2019-10-20 NOTE — Telephone Encounter (Signed)
Pt stated dry cough is more frequent. No SOB, or difficulty. Pt stated that she Is having PND and thinks it makes her cough. No feer, resp distress. Advised pt to call PCP if cough worsens

## 2019-10-28 ENCOUNTER — Other Ambulatory Visit: Payer: Self-pay | Admitting: Family Medicine

## 2019-10-28 DIAGNOSIS — G2581 Restless legs syndrome: Secondary | ICD-10-CM

## 2019-10-29 ENCOUNTER — Other Ambulatory Visit: Payer: Self-pay

## 2019-10-29 DIAGNOSIS — Z20822 Contact with and (suspected) exposure to covid-19: Secondary | ICD-10-CM

## 2019-10-31 LAB — NOVEL CORONAVIRUS, NAA: SARS-CoV-2, NAA: NOT DETECTED

## 2019-11-26 ENCOUNTER — Encounter: Payer: Self-pay | Admitting: Family Medicine

## 2019-11-27 DIAGNOSIS — Z03818 Encounter for observation for suspected exposure to other biological agents ruled out: Secondary | ICD-10-CM | POA: Diagnosis not present

## 2019-11-28 DIAGNOSIS — Z20828 Contact with and (suspected) exposure to other viral communicable diseases: Secondary | ICD-10-CM | POA: Diagnosis not present

## 2019-12-11 ENCOUNTER — Encounter: Payer: Self-pay | Admitting: Family Medicine

## 2019-12-18 ENCOUNTER — Encounter: Payer: Self-pay | Admitting: Family Medicine

## 2019-12-18 ENCOUNTER — Other Ambulatory Visit: Payer: Self-pay | Admitting: Family Medicine

## 2019-12-18 DIAGNOSIS — F988 Other specified behavioral and emotional disorders with onset usually occurring in childhood and adolescence: Secondary | ICD-10-CM

## 2019-12-18 MED ORDER — LISDEXAMFETAMINE DIMESYLATE 30 MG PO CAPS: 30.0000 mg | ORAL_CAPSULE | Freq: Every day | ORAL | 0 refills | Status: DC

## 2019-12-18 NOTE — Telephone Encounter (Signed)
I would recommend having her schedule a follow up with Dr. Loletha Grayer next week to discuss updated CXR.  Vyvanse renewed.

## 2019-12-24 ENCOUNTER — Encounter: Payer: Self-pay | Admitting: Family Medicine

## 2019-12-24 ENCOUNTER — Telehealth (INDEPENDENT_AMBULATORY_CARE_PROVIDER_SITE_OTHER): Payer: BC Managed Care – PPO | Admitting: Family Medicine

## 2019-12-24 ENCOUNTER — Other Ambulatory Visit: Payer: Self-pay

## 2019-12-24 VITALS — BP 168/102 | HR 72 | Temp 96.4°F | Ht 63.0 in

## 2019-12-24 DIAGNOSIS — I10 Essential (primary) hypertension: Secondary | ICD-10-CM

## 2019-12-24 DIAGNOSIS — R05 Cough: Secondary | ICD-10-CM

## 2019-12-24 DIAGNOSIS — Z889 Allergy status to unspecified drugs, medicaments and biological substances status: Secondary | ICD-10-CM | POA: Diagnosis not present

## 2019-12-24 DIAGNOSIS — K219 Gastro-esophageal reflux disease without esophagitis: Secondary | ICD-10-CM

## 2019-12-24 DIAGNOSIS — R053 Chronic cough: Secondary | ICD-10-CM

## 2019-12-24 NOTE — Progress Notes (Signed)
Virtual Visit via Video Note  I connected with Angela [name] on 12/24/19 at  1:30 PM EST by a video enabled telemedicine application and verified that I am speaking with the correct person using two identifiers. Location patient: home Location provider: work Persons participating in the virtual visit: patient, provider  I discussed the limitations of evaluation and management by telemedicine and the availability of in person appointments. The patient expressed understanding and agreed to proceed.  Chief Complaint  Patient presents with  . Cough    pt been having a constant cough on and off for months its worse at night when pt lays down its hard to sleep.     HPI: Angela [name] is a 58 y.o. female complains of non-productive cough on/off x several months. Cough is worse at night when pt lays down.  She feels the cough is consistent with the cough she has with dairy, but she has not been consuming much dairy. Denies SOB. + runny nose, + PND - resolved this week. She was having a sore throat at night but this resolved. She also notes ear pain which has also resolved. No fever, chills.  She had negative COVID test, last on 10/29/19.  She has been taking tessalon capsules at bedtime.  Pt has a h/o allergies, asthma.  She uses flonase daily. She does not take antihistamine daily. She has not done saline rinse or nasal saline spray.  She also notes increase in reflux in symptoms - has pepcid but has not been taking. She does endorses snacking/eating at nighttime.    She is worried bc she has not had a CXR or CT chest in "some time". 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   She is working with a functional medicine doctor in [place], Dr. Tildon Husky. BP elevated at office and was recommended to drink Hibiscus Tea 3x/day. Pt states she had lots of lab work done in 09/2019 - elevated cholesterol.  She also follows with naturopath in [place].   Past Medical History:  Diagnosis Date  . ADHD    . Allergy   . Asthma   . Cancer ([place])    [place]  . Depression   . Fibromyalgia 1999  . GERD (gastroesophageal reflux disease)   . Hx of skin cancer, basal cell 09/2016   L lower eyelid  . Hyperlipidemia   . Metabolic disorder 123456   leaky gut  . Migraines   . Sleep apnea 2015  . Thyroid disease   . Tinnitus, bilateral   . Urine incontinence     Past Surgical History:  Procedure Laterality Date  . ABDOMINAL HYSTERECTOMY     uterus only (bladder sling)  . left knee repair  10/24/2014  . left lower eyelid BCC removal    . PRP left knee  04/03/2015  . rectocele removed  01/01/2009  . right knee arthroscopy  01/15/2010  . right shoulder removal of bone spurs bursa clean up  03/06/2015    Family History  Problem Relation Age of Onset  . Arthritis Mother   . Cancer Mother   . Depression Mother   . Hyperlipidemia Mother   . Hypertension Mother   . Mental illness Mother   . Alcohol abuse Father   . Asthma Father   . COPD Father   . Heart attack Father   . Hyperlipidemia Father   . Hypertension Father   . Stroke Father   . Birth defects Brother   . Asthma Son   .  Cancer Maternal Grandmother   . Heart disease Maternal Grandmother   . Cancer Maternal Grandfather   . Cancer Paternal Grandmother   . Alcohol abuse Paternal Grandmother   . Alcohol abuse Brother   . Diabetes Brother   . Learning disabilities Brother   . Cancer Brother     Social History   Tobacco Use  . Smoking status: Never Smoker  . Smokeless tobacco: Never Used  Substance Use Topics  . Alcohol use: Yes    Comment: social  . Drug use: Never     Current Outpatient Medications:  .  benzonatate (TESSALON) 100 MG capsule, Take 1-2 capsules (100-200 mg total) by mouth 3 (three) times daily as needed for cough., Disp: 40 capsule, Rfl: 0 .  fluticasone (FLONASE) 50 MCG/ACT nasal spray, Place into both nostrils daily., Disp: , Rfl:  .  L-Methylfolate-Algae (DEPLIN 15) 15-90.314 MG CAPS, Take 15 mg by  mouth daily., Disp: 90 capsule, Rfl: 3 .  Lifitegrast (XIIDRA) 5 % SOLN, Apply to eye., Disp: , Rfl:  .  lisdexamfetamine (VYVANSE) 30 MG capsule, Take 1 capsule (30 mg total) by mouth daily., Disp: 30 capsule, Rfl: 0 .  Polyethylene Glycol 3350 (MIRALAX PO), Take by mouth., Disp: , Rfl:  .  pregabalin (LYRICA) 50 MG capsule, Take 50 mg by mouth 3 (three) times daily., Disp: , Rfl:  .  rizatriptan (MAXALT) 5 MG tablet, Take 1 tablet (5 mg total) by mouth as needed for migraine. May repeat in 2 hours if needed, Disp: 10 tablet, Rfl: 2 .  valACYclovir (VALTREX) 500 MG tablet, Take 1 tablet (500 mg total) by mouth daily., Disp: 90 tablet, Rfl: 3 .  zolpidem (AMBIEN) 5 MG tablet, Take 5 mg by mouth at bedtime as needed for sleep. 1-2/mo, Disp: , Rfl:   Allergies  Allergen Reactions  . Caffeine     Heart palpatations  . Morphine And Related     Hallucinations     ROS: See pertinent positives and negatives per HPI.   EXAM:  VITALS per patient if applicable: BP (!) [id number] Comment: per pt.  Pulse 72 Comment: per pt.  Temp (!) 96.4 F (35.8 C) (Tympanic) Comment: per pt. Comment (Src): per pt.  Ht 5\' 3"  (1.6 m)   SpO2 98% Comment: per pt.  BMI 34.97 kg/m   BP Readings from Last 3 Encounters:  12/24/19 (!) 168/102  04/22/19 (!) 142/78  03/13/19 140/88     GENERAL: alert, oriented, appears well and in no acute distress  HEENT: atraumatic, conjunctiva clear, no obvious abnormalities on inspection of external nose and ears  NECK: normal movements of the head and neck  LUNGS: on inspection no signs of respiratory distress, breathing rate appears normal, no obvious gross SOB, gasping or wheezing, no conversational dyspnea  CV: no obvious cyanosis  MS: moves all visible extremities without noticeable abnormality  PSYCH/NEURO: pleasant and cooperative, no obvious depression or anxiety, speech and thought processing grossly intact   ASSESSMENT AND PLAN: 1. Persistent cough - pt  notes a h/o valley fever, last CT chest in 2010 - I doubt cough is due to infectious or malignant etiology but will order CXR. Symptoms more likely d/t increased GERD symptoms and/or allergies - DG Chest 2 View; Future  2. Gastroesophageal reflux disease, unspecified whether esophagitis present - restart pepcid 20mg  BID x 2 wks - work to stop nighttime snacking/eating, avoid trigger foods  3. History of seasonal allergies - cont with flonase - if no improvement with  pepcid, would add anti-histamine and nasal saline spray 2-3x/day  4. Essential HTN - not on meds - functional medicine doctor recommended Hibiscus tea 3x/day - BP elevated today and previously, pt very likely needs anti-HTN med along with weight loss, low sodium diet, exercise   Pt to f/u via My Chart in 2 wks   I discussed the assessment and treatment plan with the patient. The patient was provided an opportunity to ask questions and all were answered. The patient agreed with the plan and demonstrated an understanding of the instructions.   The patient was advised to call back or seek an in-person evaluation if the symptoms worsen or if the condition fails to improve as anticipated.   Letta Median, DO

## 2019-12-25 ENCOUNTER — Other Ambulatory Visit: Payer: BC Managed Care – PPO

## 2019-12-25 ENCOUNTER — Ambulatory Visit (INDEPENDENT_AMBULATORY_CARE_PROVIDER_SITE_OTHER): Payer: BC Managed Care – PPO

## 2019-12-25 DIAGNOSIS — R05 Cough: Secondary | ICD-10-CM | POA: Diagnosis not present

## 2019-12-25 DIAGNOSIS — R053 Chronic cough: Secondary | ICD-10-CM

## 2019-12-30 ENCOUNTER — Encounter: Payer: Self-pay | Admitting: Family Medicine

## 2019-12-30 ENCOUNTER — Other Ambulatory Visit: Payer: Self-pay | Admitting: Family Medicine

## 2019-12-30 DIAGNOSIS — J849 Interstitial pulmonary disease, unspecified: Secondary | ICD-10-CM

## 2019-12-30 DIAGNOSIS — R9389 Abnormal findings on diagnostic imaging of other specified body structures: Secondary | ICD-10-CM

## 2019-12-30 DIAGNOSIS — G43909 Migraine, unspecified, not intractable, without status migrainosus: Secondary | ICD-10-CM

## 2019-12-30 DIAGNOSIS — M797 Fibromyalgia: Secondary | ICD-10-CM

## 2019-12-30 DIAGNOSIS — R05 Cough: Secondary | ICD-10-CM

## 2019-12-30 DIAGNOSIS — R053 Chronic cough: Secondary | ICD-10-CM

## 2019-12-30 DIAGNOSIS — B009 Herpesviral infection, unspecified: Secondary | ICD-10-CM

## 2019-12-30 DIAGNOSIS — Z8619 Personal history of other infectious and parasitic diseases: Secondary | ICD-10-CM

## 2019-12-30 DIAGNOSIS — F988 Other specified behavioral and emotional disorders with onset usually occurring in childhood and adolescence: Secondary | ICD-10-CM

## 2019-12-30 DIAGNOSIS — G2581 Restless legs syndrome: Secondary | ICD-10-CM

## 2019-12-30 NOTE — Telephone Encounter (Signed)
CT chest ordered for further evaluation of this.

## 2020-01-01 ENCOUNTER — Encounter: Payer: Self-pay | Admitting: Family Medicine

## 2020-01-01 MED ORDER — PREGABALIN 50 MG PO CAPS: 50.0000 mg | ORAL_CAPSULE | Freq: Three times a day (TID) | ORAL | 3 refills | Status: DC

## 2020-01-01 MED ORDER — VALACYCLOVIR HCL 500 MG PO TABS: 500.0000 mg | ORAL_TABLET | Freq: Every day | ORAL | 3 refills | Status: DC

## 2020-01-01 MED ORDER — RIZATRIPTAN BENZOATE 5 MG PO TABS: 5.0000 mg | ORAL_TABLET | ORAL | 2 refills | Status: DC | PRN

## 2020-01-01 MED ORDER — LISDEXAMFETAMINE DIMESYLATE 30 MG PO CAPS: 30.0000 mg | ORAL_CAPSULE | Freq: Every day | ORAL | 0 refills | Status: DC

## 2020-01-01 MED ORDER — PRAMIPEXOLE DIHYDROCHLORIDE 0.125 MG PO TABS: 0.1250 mg | ORAL_TABLET | Freq: Two times a day (BID) | ORAL | 3 refills | Status: DC

## 2020-01-01 MED ORDER — XIIDRA 5 % OP SOLN: 1.0000 [drp] | Freq: Two times a day (BID) | OPHTHALMIC | 3 refills | Status: DC

## 2020-01-02 ENCOUNTER — Encounter: Payer: Self-pay | Admitting: Family Medicine

## 2020-01-02 ENCOUNTER — Ambulatory Visit
Admission: RE | Admit: 2020-01-02 | Discharge: 2020-01-02 | Disposition: A | Discharge status: Home / Self Care | Payer: BC Managed Care – PPO | Source: Ambulatory Visit | Attending: Family Medicine | Admitting: Family Medicine

## 2020-01-02 DIAGNOSIS — Z8619 Personal history of other infectious and parasitic diseases: Secondary | ICD-10-CM

## 2020-01-02 DIAGNOSIS — R053 Chronic cough: Secondary | ICD-10-CM

## 2020-01-02 DIAGNOSIS — R918 Other nonspecific abnormal finding of lung field: Secondary | ICD-10-CM | POA: Diagnosis not present

## 2020-01-02 DIAGNOSIS — R05 Cough: Secondary | ICD-10-CM

## 2020-01-02 DIAGNOSIS — J849 Interstitial pulmonary disease, unspecified: Secondary | ICD-10-CM

## 2020-01-02 DIAGNOSIS — R9389 Abnormal findings on diagnostic imaging of other specified body structures: Secondary | ICD-10-CM

## 2020-01-02 DIAGNOSIS — E7800 Pure hypercholesterolemia, unspecified: Secondary | ICD-10-CM

## 2020-01-02 DIAGNOSIS — I10 Essential (primary) hypertension: Secondary | ICD-10-CM

## 2020-01-03 MED ORDER — LISINOPRIL 5 MG PO TABS: 5.0000 mg | ORAL_TABLET | Freq: Every day | ORAL | 3 refills | Status: DC

## 2020-01-03 MED ORDER — ATORVASTATIN CALCIUM 20 MG PO TABS: 20.0000 mg | ORAL_TABLET | Freq: Every day | ORAL | 3 refills | Status: DC

## 2020-01-03 NOTE — Progress Notes (Signed)
FYI results of CT for f/u abnormal CXR.  She has not been contacted regarding this yet.

## 2020-01-08 MED ORDER — BENZONATATE 100 MG PO CAPS: 100.0000 mg | ORAL_CAPSULE | Freq: Three times a day (TID) | ORAL | 1 refills | Status: DC | PRN

## 2020-01-08 MED ORDER — HYDROCOD POLST-CPM POLST ER 10-8 MG/5ML PO SUER: 5.0000 mL | Freq: Two times a day (BID) | ORAL | 0 refills | Status: DC | PRN

## 2020-01-15 DIAGNOSIS — Z03818 Encounter for observation for suspected exposure to other biological agents ruled out: Secondary | ICD-10-CM | POA: Diagnosis not present

## 2020-01-15 DIAGNOSIS — Z20828 Contact with and (suspected) exposure to other viral communicable diseases: Secondary | ICD-10-CM | POA: Diagnosis not present

## 2020-01-24 ENCOUNTER — Other Ambulatory Visit: Payer: Self-pay | Admitting: Family Medicine

## 2020-01-24 DIAGNOSIS — G43909 Migraine, unspecified, not intractable, without status migrainosus: Secondary | ICD-10-CM

## 2020-01-24 NOTE — Telephone Encounter (Signed)
Last OV 12/24/19 Last fill 01/01/20 #10/2

## 2020-01-27 ENCOUNTER — Encounter: Payer: Self-pay | Admitting: Pulmonary Disease

## 2020-01-27 ENCOUNTER — Other Ambulatory Visit: Payer: Self-pay

## 2020-01-27 ENCOUNTER — Ambulatory Visit (INDEPENDENT_AMBULATORY_CARE_PROVIDER_SITE_OTHER): Payer: BC Managed Care – PPO | Admitting: Pulmonary Disease

## 2020-01-27 VITALS — BP 120/82 | HR 78 | Temp 97.3°F | Ht 63.0 in | Wt 202.4 lb

## 2020-01-27 DIAGNOSIS — R05 Cough: Secondary | ICD-10-CM

## 2020-01-27 DIAGNOSIS — R059 Cough, unspecified: Secondary | ICD-10-CM

## 2020-01-27 LAB — CBC WITH DIFFERENTIAL/PLATELET
Basophils Absolute: 0 10*3/uL (ref 0.0–0.1)
Basophils Relative: 0.9 % (ref 0.0–3.0)
Eosinophils Absolute: 0.1 10*3/uL (ref 0.0–0.7)
Eosinophils Relative: 2.7 % (ref 0.0–5.0)
HCT: 41.9 % (ref 36.0–46.0)
Hemoglobin: 13.9 g/dL (ref 12.0–15.0)
Lymphocytes Relative: 36.6 % (ref 12.0–46.0)
Lymphs Abs: 1.8 10*3/uL (ref 0.7–4.0)
MCHC: 33.1 g/dL (ref 30.0–36.0)
MCV: 84.1 fl (ref 78.0–100.0)
Monocytes Absolute: 0.5 10*3/uL (ref 0.1–1.0)
Monocytes Relative: 10.5 % (ref 3.0–12.0)
Neutro Abs: 2.4 10*3/uL (ref 1.4–7.7)
Neutrophils Relative %: 49.3 % (ref 43.0–77.0)
Platelets: 225 10*3/uL (ref 150.0–400.0)
RBC: 4.99 Mil/uL (ref 3.87–5.11)
RDW: 13 % (ref 11.5–15.5)
WBC: 4.8 10*3/uL (ref 4.0–10.5)

## 2020-01-27 MED ORDER — BREO ELLIPTA 200-25 MCG/INH IN AEPB: 1.0000 | INHALATION_SPRAY | Freq: Every day | RESPIRATORY_TRACT | 0 refills | Status: DC

## 2020-01-27 MED ORDER — BREO ELLIPTA 200-25 MCG/INH IN AEPB: 1.0000 | INHALATION_SPRAY | Freq: Every day | RESPIRATORY_TRACT | 3 refills | Status: DC

## 2020-01-27 NOTE — Patient Instructions (Addendum)
We will check some labs today including CBC differential, respiratory allergy profile, mold antibody panel, hypersensitivity panel, ANA, rheumatoid factor, CCP We will start you on an inhaler called Breo 200 Schedule pulmonary function test Follow-up in 4 weeks

## 2020-01-27 NOTE — Progress Notes (Signed)
Angela Fiedor    [id number]    [birth year]  Primary Care Physician:Cirigliano, Garvin Fila, DO  Referring Physician: Ronnald Nian, [place],  [place] [address]  Chief complaint: Consult for ILD evaluation  HPI: 59 year old with history of hypertension, childhood asthma, hyperlipidemia, skin cancer, allergies, sleep apnea, fibromyalgia, dermatographia, chronic fatigue  Complains of persistent cough, nonproductive in nature for the past several months.  Denies any fevers, chills.  She has been tested negative for Covid on 10/29/2019.  Recent CT chest shows minimal groundglass opacities and has been referred to pulmonary for further evaluation.  She has been started on lisinopril in Jan 2021 for hypertension but her cough has preceded initiation of this medication. 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  Pets: No pets Occupation: Retired Marine scientist, Education officer, museum Exposures: Reports some mold in her travel.  No hot tub, Jacuzzi, humidifier.  No feather pillows or comforters Smoking history: Never smoked Travel history: Originally from [place].  Has been living in [place] for 2 to 3 years Relevant family history: No significant family history of lung disease   Outpatient Encounter Medications as of 01/27/2020  Medication Sig  . atorvastatin (LIPITOR) 20 MG tablet Take 1 tablet (20 mg total) by mouth daily.  . benzonatate (TESSALON) 100 MG capsule Take 1-2 capsules (100-200 mg total) by mouth 3 (three) times daily as needed for cough.  . chlorpheniramine-HYDROcodone (TUSSIONEX PENNKINETIC ER) 10-8 MG/5ML SUER Take 5 mLs by mouth every 12 (twelve) hours as needed for cough.  . fluticasone (FLONASE) 50 MCG/ACT nasal spray Place into both nostrils daily.  Marland Kitchen  L-Methylfolate-Algae (DEPLIN 15) 15-90.314 MG CAPS Take 15 mg by mouth daily.  Marland Kitchen Lifitegrast (XIIDRA) 5 % SOLN Apply 1 drop to eye 2 (two) times daily. Apply to eye.  . lisdexamfetamine (VYVANSE) 30 MG capsule Take 1 capsule (30 mg total) by mouth daily.  Marland Kitchen lisinopril (ZESTRIL) 5 MG tablet Take 1 tablet (5 mg total) by mouth daily.  . pramipexole (MIRAPEX) 0.125 MG tablet Take 1 tablet (0.125 mg total) by mouth 2 (two) times daily.  . pregabalin (LYRICA) 50 MG capsule Take 1 capsule (50 mg total) by mouth 3 (three) times daily.  . rizatriptan (MAXALT) 5 MG tablet TAKE 1 TABLET BY MOUTH AS NEEDED FOR MIGRAINE. MAY REPEAT IN 2 HOURS IF NEEDED  . valACYclovir (VALTREX) 500 MG tablet Take 1 tablet (500 mg total) by mouth daily.  Marland Kitchen zolpidem (AMBIEN) 5 MG tablet Take 5 mg by mouth at bedtime as needed for sleep. 1-2/mo  . [DISCONTINUED] atorvastatin (LIPITOR) 10 MG tablet Take 10 mg by mouth daily.  . [DISCONTINUED] Polyethylene Glycol 3350 (MIRALAX PO) Take by mouth.   No facility-administered encounter medications on file as of 01/27/2020.    Allergies as of 01/27/2020 - Review Complete 01/27/2020  Allergen Reaction Noted  . Caffeine  02/27/2019  . Morphine and related  02/27/2019    Past Medical History:  Diagnosis Date  . ADHD   . Allergy   . Asthma   . Cancer ([place])    [place]  . Depression   . Fibromyalgia 1999  . GERD (gastroesophageal reflux disease)   . Hx of skin cancer, basal cell 09/2016   L lower eyelid  . Hyperlipidemia   . Metabolic  disorder 2015   leaky gut  . Migraines   . Sleep apnea 2015  . Thyroid disease   . Tinnitus, bilateral   . Urine incontinence     Past Surgical History:  Procedure Laterality Date  . ABDOMINAL HYSTERECTOMY     uterus only (bladder sling)  . left knee repair  10/24/2014  . left lower eyelid BCC removal    . PRP left knee  04/03/2015  . rectocele removed  01/01/2009  . right knee arthroscopy  01/15/2010  . right shoulder removal of bone  spurs bursa clean up  03/06/2015    Family History  Problem Relation Age of Onset  . Arthritis Mother   . Cancer Mother   . Depression Mother   . Hyperlipidemia Mother   . Hypertension Mother   . Mental illness Mother   . Alcohol abuse Father   . Asthma Father   . COPD Father   . Heart attack Father   . Hyperlipidemia Father   . Hypertension Father   . Stroke Father   . Birth defects Brother   . Asthma Son   . Cancer Maternal Grandmother   . Heart disease Maternal Grandmother   . Cancer Maternal Grandfather   . Cancer Paternal Grandmother   . Alcohol abuse Paternal Grandmother   . Alcohol abuse Brother   . Diabetes Brother   . Learning disabilities Brother   . Cancer Brother     Social History   Socioeconomic History  . Marital status: Divorced    Spouse name: Not on file  . Number of children: Not on file  . Years of education: Not on file  . Highest education level: Not on file  Occupational History  . Not on file  Tobacco Use  . Smoking status: Never Smoker  . Smokeless tobacco: Never Used  Substance and Sexual Activity  . Alcohol use: Yes    Comment: social  . Drug use: Never  . Sexual activity: Not on file  Other Topics Concern  . Not on file  Social History Narrative  . Not on file   Social Determinants of Health   Financial Resource Strain:   . Difficulty of Paying Living Expenses: Not on file  Food Insecurity:   . Worried About Charity fundraiser in the Last Year: Not on file  . Ran Out of Food in the Last Year: Not on file  Transportation Needs:   . Lack of Transportation (Medical): Not on file  . Lack of Transportation (Non-Medical): Not on file  Physical Activity:   . Days of Exercise per Week: Not on file  . Minutes of Exercise per Session: Not on file  Stress:   . Feeling of Stress : Not on file  Social Connections:   . Frequency of Communication with Friends and Family: Not on file  . Frequency of Social Gatherings with Friends and  Family: Not on file  . Attends Religious Services: Not on file  . Active Member of Clubs or Organizations: Not on file  . Attends Archivist Meetings: Not on file  . Marital Status: Not on file  Intimate Partner Violence:   . Fear of Current or Ex-Partner: Not on file  . Emotionally Abused: Not on file  . Physically Abused: Not on file  . Sexually Abused: Not on file    Review of systems: Review of Systems  Constitutional: Negative for fever and chills.  HENT: Negative.   Eyes: Negative for blurred vision.  Respiratory: as per HPI  Cardiovascular: Negative for chest pain and palpitations.  Gastrointestinal: Negative for vomiting, diarrhea, blood per rectum. Genitourinary: Negative for dysuria, urgency, frequency and hematuria.  Musculoskeletal: Negative for myalgias, back pain and joint pain.  Skin: Negative for itching and rash.  Neurological: Negative for dizziness, tremors, focal weakness, seizures and loss of consciousness.  Endo/Heme/Allergies: Negative for environmental allergies.  Psychiatric/Behavioral: Negative for depression, suicidal ideas and hallucinations.  All other systems reviewed and are negative.  Physical Exam: Blood pressure 120/82, pulse 78, temperature (!) 97.3 F (36.3 C), temperature source Temporal, height 5\' 3"  (1.6 m), weight 202 lb 6.4 oz (91.8 kg), SpO2 97 %. Gen:      No acute distress HEENT:  EOMI, sclera anicteric Neck:     No masses; no thyromegaly Lungs:    Clear to auscultation bilaterally; normal respiratory effort CV:         Regular rate and rhythm; no murmurs Abd:      + bowel sounds; soft, non-tender; no palpable masses, no distension Ext:    No edema; adequate peripheral perfusion Skin:      Warm and dry; no rash Neuro: alert and oriented x 3 Psych: normal mood and affect  Data Reviewed: Imaging: High-resolution CT 12/27/2019-no interstitial lung disease.  Very minimal groundglass attenuation with mild air trapping.  Tiny 2  to 3 mm lung nodules.  Aortic, coronary atherosclerosis. I have reviewed the images personally.  Assessment:  Evaluation for ILD I have reviewed the high-resolution CT with no convincing evidence of interstitial lung disease.  There is minimal groundglass with air trapping which may be from asthma, reactive airway disease.  She does have history of childhood asthma and allergies  May have exposure to mold but CTs not suggestive of hypersensitivity pneumonitis We will check baseline labs including ANA, rheumatoid factor, CCP, CBC differential, allergy profile, mold antibody and hypersensitivity panel. Schedule pulmonary function test Trial Breo inhaler  Plan/Recommendations: - Baseline labs - Pulmonary function test - Breo 200 inhaler  This appointment required 60 minutes of patient care (this includes precharting, chart review, review of results, face-to-face care, etc.).  Marshell Garfinkel MD [facility] 01/27/2020, 1:43 PM  CC: Ronnald Nian, DO

## 2020-01-28 ENCOUNTER — Encounter: Payer: Self-pay | Admitting: Family Medicine

## 2020-01-28 ENCOUNTER — Encounter: Payer: Self-pay | Admitting: Pulmonary Disease

## 2020-01-28 DIAGNOSIS — G2581 Restless legs syndrome: Secondary | ICD-10-CM

## 2020-01-28 LAB — RESPIRATORY ALLERGY PROFILE REGION II ~~LOC~~

## 2020-01-28 LAB — INTERPRETATION:

## 2020-01-29 ENCOUNTER — Other Ambulatory Visit: Payer: Self-pay | Admitting: Family Medicine

## 2020-01-29 LAB — ANTI-NUCLEAR AB-TITER (ANA TITER): ANA Titer 1: 1:320 {titer} — ABNORMAL HIGH

## 2020-01-29 LAB — ALLERGEN PROFILE, MOLD
Allergen, A. alternata, m6: <0.1 kU/L
Allergen, Mucor Racemosus, M4: <0.1 kU/L
Allergen, P. notatum, m1: <0.1 kU/L
Allergen, S. Botryosum, m10: <0.1 kU/L
Aspergillus fumigatus, m3: <0.1 kU/L
CLADOSPORIUM HERBARUM (M2) IGE: <0.1 kU/L
CLASS: 0
Class: 0
Class: 0
Class: 0
Class: 0
Class: 0

## 2020-01-29 LAB — RHEUMATOID FACTOR: Rheumatoid fact SerPl-aCnc: <14 IU/mL (ref <14)

## 2020-01-29 LAB — CYCLIC CITRUL PEPTIDE ANTIBODY, IGG: Cyclic Citrullin Peptide Ab: <16 UNITS

## 2020-01-29 LAB — INTERPRETATION:

## 2020-01-29 LAB — ANA: Anti Nuclear Antibody (ANA): POSITIVE — AB

## 2020-01-29 MED ORDER — PRAMIPEXOLE DIHYDROCHLORIDE 0.125 MG PO TABS: 0.2500 mg | ORAL_TABLET | Freq: Every day | ORAL | 3 refills | Status: DC

## 2020-01-29 NOTE — Telephone Encounter (Signed)
I called and discussed all question about labs  Please make referral to Rheumatology to evaluate positive ANA

## 2020-01-29 NOTE — Telephone Encounter (Signed)
MC-Plz see refill req/thx dmf 

## 2020-01-30 ENCOUNTER — Telehealth: Payer: Self-pay

## 2020-01-30 ENCOUNTER — Encounter: Payer: Self-pay | Admitting: Family Medicine

## 2020-01-30 DIAGNOSIS — R768 Other specified abnormal immunological findings in serum: Secondary | ICD-10-CM

## 2020-01-30 NOTE — Telephone Encounter (Signed)
I have placed an order for a referral to Rheumatology per Dr. Sandria Senter request.

## 2020-01-31 LAB — HYPERSENSITIVITY PNEUMONITIS
A. Pullulans Abs: NEGATIVE
A.Fumigatus #1 Abs: NEGATIVE
Micropolyspora faeni, IgG: NEGATIVE
Pigeon Serum Abs: NEGATIVE
Thermoact. Saccharii: NEGATIVE
Thermoactinomyces vulgaris, IgG: NEGATIVE

## 2020-02-10 DIAGNOSIS — K760 Fatty (change of) liver, not elsewhere classified: Secondary | ICD-10-CM | POA: Diagnosis not present

## 2020-02-10 NOTE — Telephone Encounter (Signed)
Dr. Vaughan Browner, please see pt's mychart message and advise on it for pt.

## 2020-02-12 DIAGNOSIS — R5382 Chronic fatigue, unspecified: Secondary | ICD-10-CM | POA: Diagnosis not present

## 2020-02-12 DIAGNOSIS — R768 Other specified abnormal immunological findings in serum: Secondary | ICD-10-CM | POA: Diagnosis not present

## 2020-02-12 DIAGNOSIS — M797 Fibromyalgia: Secondary | ICD-10-CM | POA: Diagnosis not present

## 2020-02-12 DIAGNOSIS — J849 Interstitial pulmonary disease, unspecified: Secondary | ICD-10-CM | POA: Diagnosis not present

## 2020-02-12 MED ORDER — HYDROCODONE-HOMATROPINE 5-1.5 MG/5ML PO SYRP: 5.0000 mL | ORAL_SOLUTION | Freq: Four times a day (QID) | ORAL | 0 refills | Status: DC | PRN

## 2020-02-12 NOTE — Telephone Encounter (Signed)
I called and discussed with pt. Nothing further needed  She just saw Dr. Trudie Reed, Rheumatology who has ordered additional labs for autoimmune work-up but feels there is no clear evidence of lupus.  She is getting treated aggressively for postnasal drip and GERD with increased nasal spray, antihistamine and PPI. Patient is requesting order for Hycodan temporarily while we wait for these measures to work.  I have called in the prescription.

## 2020-02-19 ENCOUNTER — Encounter: Payer: Self-pay | Admitting: Family Medicine

## 2020-02-27 ENCOUNTER — Encounter: Payer: Self-pay | Admitting: Family Medicine

## 2020-03-03 ENCOUNTER — Encounter: Payer: Self-pay | Admitting: Family Medicine

## 2020-03-03 DIAGNOSIS — R053 Chronic cough: Secondary | ICD-10-CM

## 2020-03-03 DIAGNOSIS — R05 Cough: Secondary | ICD-10-CM

## 2020-03-06 NOTE — Telephone Encounter (Signed)
Sorry. Just saw this.   I am not sure if her cough is from the lung as CT is is not impressive. Thanks for making the referral to ENT. Agree that next step would be GI. I will also call her and assess her for OSA which can rarely  present as cough.

## 2020-03-06 NOTE — Telephone Encounter (Signed)
Sounds great.  Thank you. 

## 2020-03-10 NOTE — Telephone Encounter (Signed)
Called and spoke with patient.  She has already been tested for sleep apnea recently and told she has very mild disease She is not on CPAP but is using a dental device.  No significant episodes of snoring We will hold off on sleep testing for now  I have asked her to try chlorpheniramine antihistamine 8 mg 3 times a day over-the-counter for 1 week to see if we can reduce the postnasal drip She has ENT appointment already scheduled I will see her back in clinic after PFTs on 4/12

## 2020-03-11 NOTE — Telephone Encounter (Signed)
ATC patient to schedule her in office with Dr. Vaughan Browner after her PFT. I didn't not get an answer and left her a voicemail to return the call.

## 2020-03-16 ENCOUNTER — Other Ambulatory Visit: Payer: Self-pay

## 2020-03-16 ENCOUNTER — Encounter (INDEPENDENT_AMBULATORY_CARE_PROVIDER_SITE_OTHER): Payer: Self-pay | Admitting: Otolaryngology

## 2020-03-16 ENCOUNTER — Ambulatory Visit (INDEPENDENT_AMBULATORY_CARE_PROVIDER_SITE_OTHER): Payer: BC Managed Care – PPO | Admitting: Otolaryngology

## 2020-03-16 VITALS — Temp 97.7°F

## 2020-03-16 DIAGNOSIS — J310 Chronic rhinitis: Secondary | ICD-10-CM

## 2020-03-16 DIAGNOSIS — R490 Dysphonia: Secondary | ICD-10-CM | POA: Diagnosis not present

## 2020-03-16 NOTE — Progress Notes (Signed)
HPI: Angela [name] is a 59 y.o. female who presents is referred by her PCP for evaluation of complaints of chronic postnasal drainage, chronic cough and intermittent hoarseness.  She stated that her cough started back in July with a lot of sinus drainage..  She describes coughing up a lot of mucus especially in the mornings as well as at night.  She has been on Flonase twice daily.  She has tried Investment banker, operational without much benefit.  She was just recently started on Chlortrimazole which seems to help more.  She is also been prescribed omeprazole for GE reflux disease.  She has seen pulmonary because of the chronic cough and is scheduled for PFTs. She denies any pain or pressure in the sinus region.  Denies any yellow-green discharge from her nose.  She complains mostly of just chronic postnasal drainage.  She has had intermittent hoarseness although she is not really not that hoarse today in the office. Of note she was recently started on lisinopril a couple of months ago for hypertension.  Past Medical History:  Diagnosis Date  . ADHD   . Allergy   . Asthma   . Cancer ([place])    [place]  . Depression   . Fibromyalgia 1999  . GERD (gastroesophageal reflux disease)   . Hx of skin cancer, basal cell 09/2016   L lower eyelid  . Hyperlipidemia   . Metabolic disorder 123456   leaky gut  . Migraines   . Sleep apnea 2015  . Thyroid disease   . Tinnitus, bilateral   . Urine incontinence    Past Surgical History:  Procedure Laterality Date  . ABDOMINAL HYSTERECTOMY     uterus only (bladder sling)  . left knee repair  10/24/2014  . left lower eyelid BCC removal    . PRP left knee  04/03/2015  . rectocele removed  01/01/2009  . right knee arthroscopy  01/15/2010  . right shoulder removal of bone spurs bursa clean up  03/06/2015   Social History   Socioeconomic History  . Marital status: Divorced    Spouse name: Not on file  . Number of children: Not on file  . Years of education: Not  on file  . Highest education level: Not on file  Occupational History  . Not on file  Tobacco Use  . Smoking status: Passive Smoke Exposure - Never Smoker  . Smokeless tobacco: Never Used  Substance and Sexual Activity  . Alcohol use: Yes    Comment: social  . Drug use: Never  . Sexual activity: Not on file  Other Topics Concern  . Not on file  Social History Narrative  . Not on file   Social Determinants of Health   Financial Resource Strain:   . Difficulty of Paying Living Expenses:   Food Insecurity:   . Worried About Charity fundraiser in the Last Year:   . Arboriculturist in the Last Year:   Transportation Needs:   . Film/video editor (Medical):   Marland Kitchen Lack of Transportation (Non-Medical):   Physical Activity:   . Days of Exercise per Week:   . Minutes of Exercise per Session:   Stress:   . Feeling of Stress :   Social Connections:   . Frequency of Communication with Friends and Family:   . Frequency of Social Gatherings with Friends and Family:   . Attends Religious Services:   . Active Member of Clubs or Organizations:   . Attends Club  or Organization Meetings:   Marland Kitchen Marital Status:    Family History  Problem Relation Age of Onset  . Arthritis Mother   . Cancer Mother   . Depression Mother   . Hyperlipidemia Mother   . Hypertension Mother   . Mental illness Mother   . Alcohol abuse Father   . Asthma Father   . COPD Father   . Heart attack Father   . Hyperlipidemia Father   . Hypertension Father   . Stroke Father   . Birth defects Brother   . Asthma Son   . Cancer Maternal Grandmother   . Heart disease Maternal Grandmother   . Cancer Maternal Grandfather   . Cancer Paternal Grandmother   . Alcohol abuse Paternal Grandmother   . Alcohol abuse Brother   . Diabetes Brother   . Learning disabilities Brother   . Cancer Brother    Allergies  Allergen Reactions  . Caffeine     Heart palpatations  . Morphine And Related     Hallucinations     Prior to Admission medications   Medication Sig Start Date End Date Taking? Authorizing Provider  atorvastatin (LIPITOR) 20 MG tablet Take 1 tablet (20 mg total) by mouth daily. 01/03/20  Yes Cirigliano, Mary K, DO  benzonatate (TESSALON) 100 MG capsule TAKE 1 TO 2 CAPSULES(100 TO 200 MG) BY MOUTH THREE TIMES DAILY AS NEEDED FOR COUGH 01/29/20  Yes Cirigliano, Garvin Fila, DO  chlorpheniramine-HYDROcodone (TUSSIONEX PENNKINETIC ER) 10-8 MG/5ML SUER Take 5 mLs by mouth every 12 (twelve) hours as needed for cough. 01/08/20  Yes Cirigliano, Mary K, DO  fluticasone (FLONASE) 50 MCG/ACT nasal spray Place into both nostrils daily.   Yes [provider]  fluticasone furoate-vilanterol (BREO ELLIPTA) 200-25 MCG/INH AEPB Inhale 1 puff into the lungs daily. 01/27/20  Yes Mannam, Praveen, MD  fluticasone furoate-vilanterol (BREO ELLIPTA) 200-25 MCG/INH AEPB Inhale 1 puff into the lungs daily. 01/27/20  Yes Mannam, Praveen, MD  HYDROcodone-homatropine (HYCODAN) 5-1.5 MG/5ML syrup Take 5 mLs by mouth every 6 (six) hours as needed for cough. 02/12/20  Yes Mannam, Praveen, MD  L-Methylfolate-Algae (DEPLIN 15) 15-90.314 MG CAPS Take 15 mg by mouth daily. 02/27/19  Yes Cirigliano, Mary K, DO  Lifitegrast (XIIDRA) 5 % SOLN Apply 1 drop to eye 2 (two) times daily. Apply to eye. 01/01/20  Yes Cirigliano, Mary K, DO  lisdexamfetamine (VYVANSE) 30 MG capsule Take 1 capsule (30 mg total) by mouth daily. 01/01/20  Yes Cirigliano, Mary K, DO  lisinopril (ZESTRIL) 5 MG tablet Take 1 tablet (5 mg total) by mouth daily. 01/03/20  Yes Cirigliano, Mary K, DO  pramipexole (MIRAPEX) 0.125 MG tablet Take 2 tablets (0.25 mg total) by mouth at bedtime. 01/29/20  Yes Cirigliano, Mary K, DO  pregabalin (LYRICA) 50 MG capsule Take 1 capsule (50 mg total) by mouth 3 (three) times daily. 01/01/20  Yes Cirigliano, Mary K, DO  rizatriptan (MAXALT) 5 MG tablet TAKE 1 TABLET BY MOUTH AS NEEDED FOR MIGRAINE. MAY REPEAT IN 2 HOURS IF NEEDED 01/24/20  Yes  Cirigliano, Mary K, DO  valACYclovir (VALTREX) 500 MG tablet Take 1 tablet (500 mg total) by mouth daily. 01/01/20  Yes Cirigliano, Mary K, DO  zolpidem (AMBIEN) 5 MG tablet Take 5 mg by mouth at bedtime as needed for sleep. 1-2/mo   Yes [provider]     Positive ROS: Otherwise negative  All other systems have been reviewed and were otherwise negative with the exception of those mentioned in  the HPI and as above.  Physical Exam: Constitutional: Alert, well-appearing, no acute distress Ears: External ears without lesions or tenderness. Ear canals are clear bilaterally with intact, clear TMs.  Nasal: External nose without lesions. Septum mildly deviated to the left..  On nasal endoscopy both middle meatus regions were clear.  No significant mucopurulent discharge noted from the sinuses.  Nasal cavity was otherwise dry with minimal clear mucus. Oral: Lips and gums without lesions. Tongue and palate mucosa without lesions. Posterior oropharynx clear. Fiberoptic laryngoscopy was performed through the right nostril.  The nasopharynx was clear.  Posterior ethmoid and sphenoid region was clear with no active drainage noted.  Base of tongue vallecula and epiglottis were normal.  Vocal cords were clear bilaterally with normal vocal cord mobility. Neck: No palpable adenopathy or masses Respiratory: Breathing comfortably  Skin: No facial/neck lesions or rash noted.  Laryngoscopy  Date/Time: 03/16/2020 5:54 PM Performed by: Rozetta Nunnery, MD Authorized by: Rozetta Nunnery, MD   Consent:    Consent obtained:  Verbal   Consent given by:  Patient   Risks discussed:  Pain Procedure details:    Indications: hoarseness, dysphagia, or aspiration and sino-nasal symptoms     Instrument: flexible fiberoptic laryngoscope   Sinus:    Right middle meatus: normal     Left middle meatus: normal   Mouth:    Vallecula: normal     Base of tongue: normal     Epiglottis: normal    Throat:    True vocal cords: normal   Comments:     Vocal cords were clear bilaterally with normal vocal cord mobility. Likewise sinus regions were clear with no evidence of active infection.    Assessment: Chronic rhinitis. Chronic cough Intermittent hoarseness.  Plan: Recommended to continue use of Flonase 2 sprays each nostril at night. I also prescribed azelastine nasal spray 1 spray twice daily. Also discussed with her concerning use of saline nasal irrigation to help dry the nose and suggested trying Xlear brand. 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 Also briefly discussed with her that lisinopril (which is an ACE inhibitor) can also cause a chronic cough.  However would recommend discussing this with her PCP.   Radene Journey, MD   CC:

## 2020-03-16 NOTE — Telephone Encounter (Signed)
ATC patient again today and not able to get in contact with her. I also sent her a my chart message hoping to get in contact with her that way.

## 2020-04-01 ENCOUNTER — Encounter: Payer: Self-pay | Admitting: Family Medicine

## 2020-04-02 ENCOUNTER — Other Ambulatory Visit (HOSPITAL_COMMUNITY)
Admission: RE | Admit: 2020-04-02 | Discharge: 2020-04-02 | Disposition: A | Discharge status: Home / Self Care | Payer: BC Managed Care – PPO | Source: Ambulatory Visit | Attending: Pulmonary Disease | Admitting: Pulmonary Disease

## 2020-04-02 ENCOUNTER — Encounter: Payer: Self-pay | Admitting: Family Medicine

## 2020-04-02 DIAGNOSIS — Z01812 Encounter for preprocedural laboratory examination: Secondary | ICD-10-CM | POA: Insufficient documentation

## 2020-04-02 DIAGNOSIS — Z20822 Contact with and (suspected) exposure to covid-19: Secondary | ICD-10-CM | POA: Diagnosis not present

## 2020-04-02 LAB — SARS CORONAVIRUS 2 (TAT 6-24 HRS): SARS Coronavirus 2: NEGATIVE

## 2020-04-02 NOTE — Telephone Encounter (Signed)
Please see message . Thank you .

## 2020-04-03 MED ORDER — LOSARTAN POTASSIUM 25 MG PO TABS: 25.0000 mg | ORAL_TABLET | Freq: Every day | ORAL | 3 refills | Status: DC

## 2020-04-06 ENCOUNTER — Ambulatory Visit (INDEPENDENT_AMBULATORY_CARE_PROVIDER_SITE_OTHER): Payer: BC Managed Care – PPO | Admitting: Pulmonary Disease

## 2020-04-06 ENCOUNTER — Other Ambulatory Visit: Payer: Self-pay

## 2020-04-06 DIAGNOSIS — R05 Cough: Secondary | ICD-10-CM | POA: Diagnosis not present

## 2020-04-06 DIAGNOSIS — R059 Cough, unspecified: Secondary | ICD-10-CM

## 2020-04-06 LAB — PULMONARY FUNCTION TEST
DL/VA % pred: 152 %
DL/VA: 6.45 ml/min/mmHg/L
DLCO unc % pred: 121 %
DLCO unc: 23.68 ml/min/mmHg
FEF 25-75 Post: 3.12 L/sec
FEF 25-75 Pre: 2.85 L/sec
FEF2575-%Change-Post: 9 %
FEF2575-%Pred-Post: 136 %
FEF2575-%Pred-Pre: 124 %
FEV1-%Change-Post: 1 %
FEV1-%Pred-Post: 72 %
FEV1-%Pred-Pre: 71 %
FEV1-Post: 1.78 L
FEV1-Pre: 1.76 L
FEV1FVC-%Change-Post: 3 %
FEV1FVC-%Pred-Pre: 112 %
FEV6-%Change-Post: -2 %
FEV6-%Pred-Post: 63 %
FEV6-%Pred-Pre: 65 %
FEV6-Post: 1.95 L
FEV6-Pre: 2 L
FEV6FVC-%Pred-Post: 103 %
FEV6FVC-%Pred-Pre: 103 %
FVC-%Change-Post: -2 %
FVC-%Pred-Post: 61 %
FVC-%Pred-Pre: 62 %
FVC-Post: 1.95 L
FVC-Pre: 2 L
Post FEV1/FVC ratio: 91 %
Post FEV6/FVC ratio: 100 %
Pre FEV1/FVC ratio: 88 %
Pre FEV6/FVC Ratio: 100 %

## 2020-04-06 NOTE — Progress Notes (Signed)
PFT completed today.  

## 2020-04-09 IMAGING — MR MRI OF THE LEFT SHOULDER WITH CONTRAST
6 series · 40 of 40 positions shown · IV contrast (agent unspecified)
Comparison: None.

CLINICAL DATA: Shoulder pain for 2 years. No known injury. Pain
with sleeping. Limited range of motion.

EXAM:
MR ARTHROGRAM OF THE LEFT SHOULDER
TECHNIQUE: Multiplanar, multisequence MR imaging of the left shoulder was
performed following the administration of intra-articular contrast.
CONTRAST:  See Injection Documentation.

[Series 3: T1 fat-sat · axial · 4.0mm · 0.27mm/px · z∈[-71,+11]mm · 8 of 18 slices shown (1 of 4)]
[im 1/18]
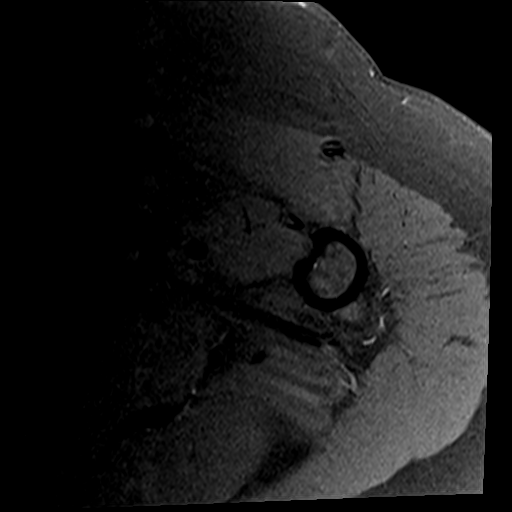
[im 3/18]
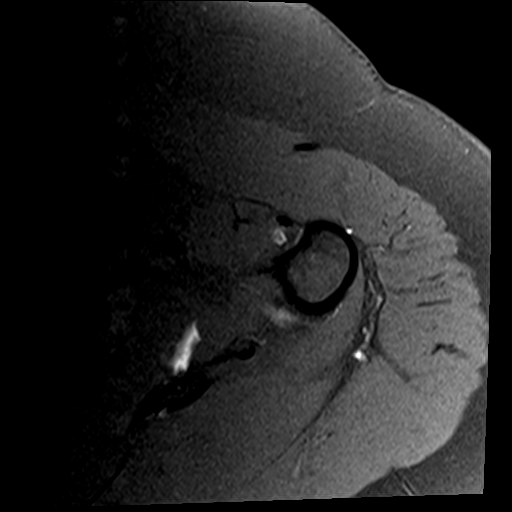
[im 5/18]
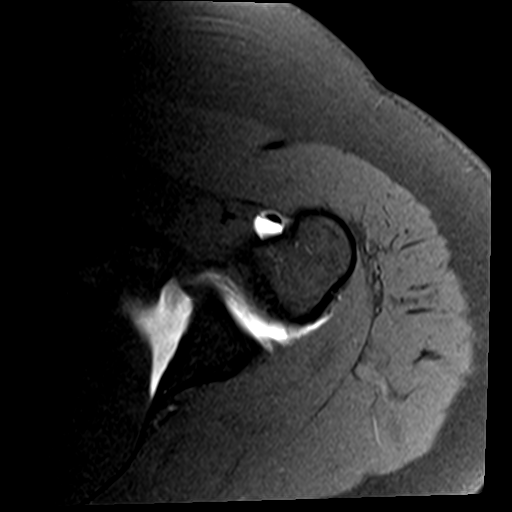
[im 8/18]
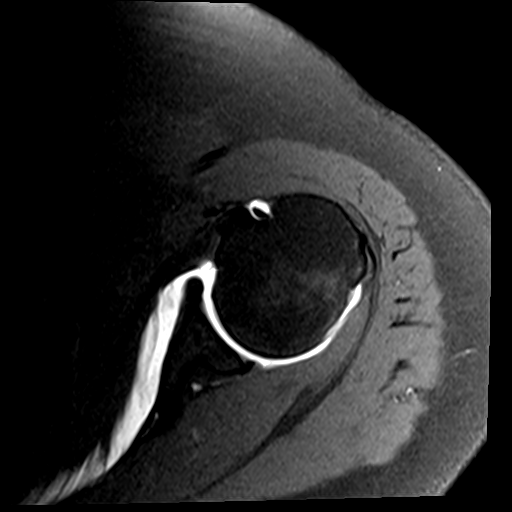
[im 10/18]
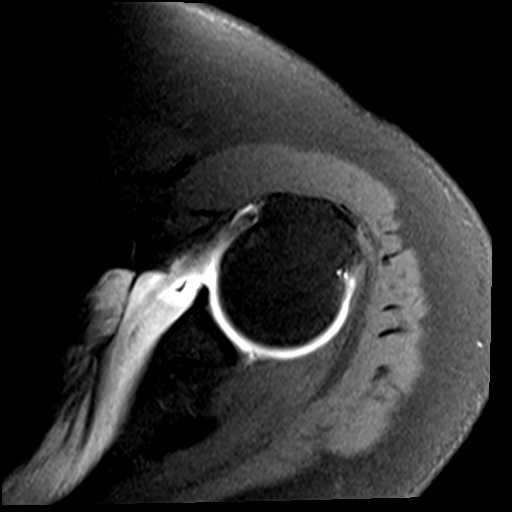
[im 13/18]
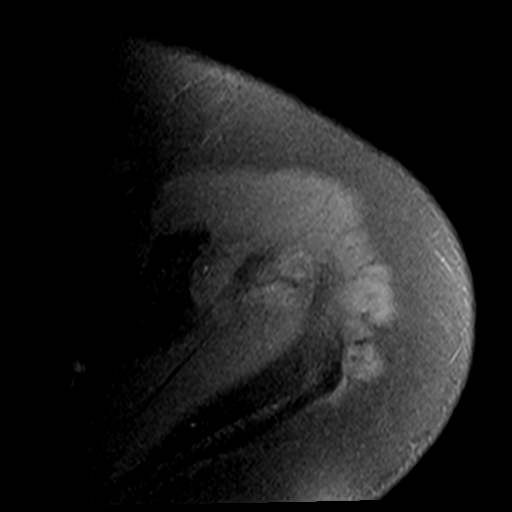
[im 15/18]
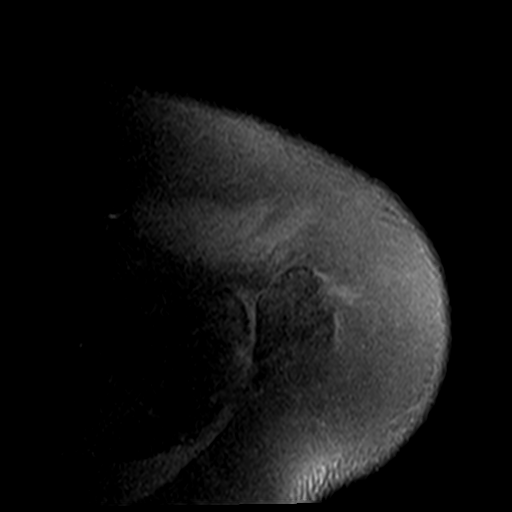
[im 18/18]
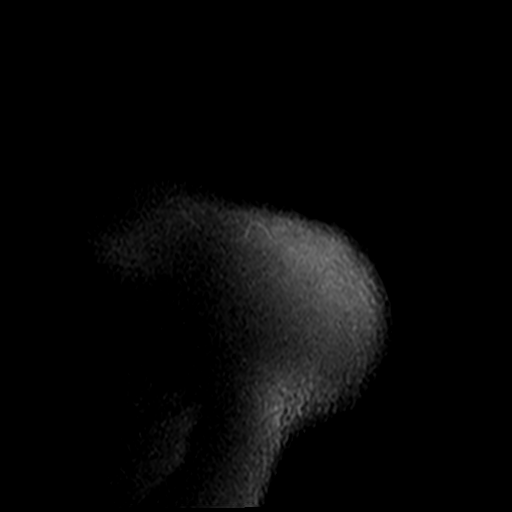

[Series 4: T2 fat-sat · oblique · 4.0mm · 0.55mm/px · 8 of 18 slices shown (1 of 2)]
[im 1/18]
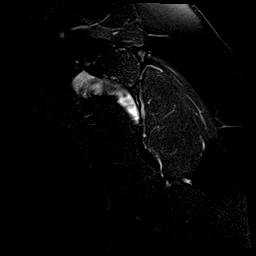
[im 3/18]
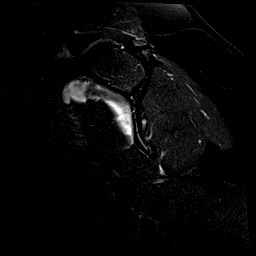
[im 5/18]
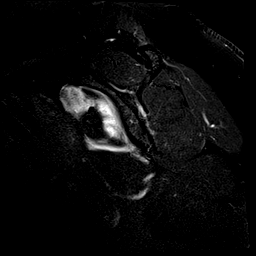
[im 8/18]
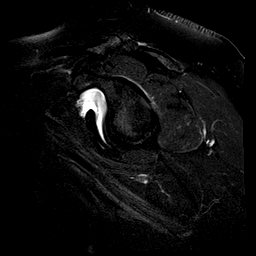
[im 10/18]
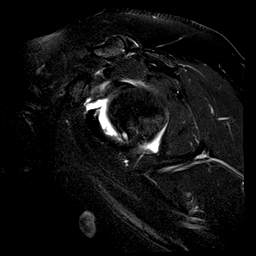
[im 13/18]
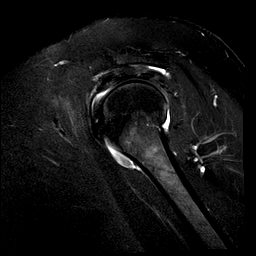
[im 15/18]
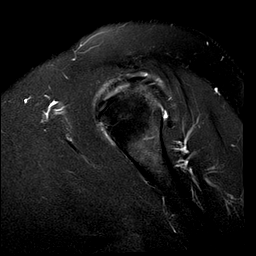
[im 18/18]
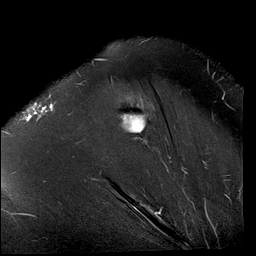

[Series 5: T1 fat-sat · oblique · 4.0mm · 0.55mm/px · 6 of 16 slices shown (2 of 4)]
[im 1/16]
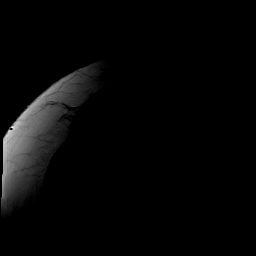
[im 4/16]
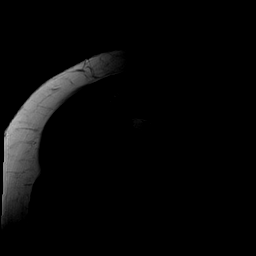
[im 7/16]
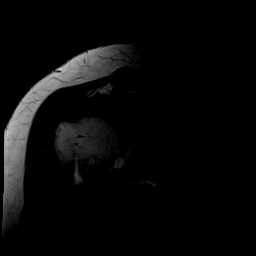
[im 10/16]
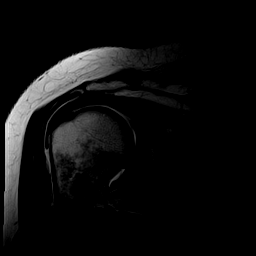
[im 13/16]
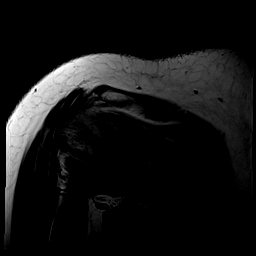
[im 16/16]
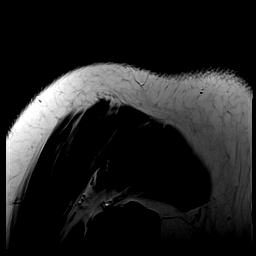

[Series 6: T1 fat-sat · oblique · 4.0mm · 0.55mm/px · 6 of 16 slices shown (3 of 4)]
[im 1/16]
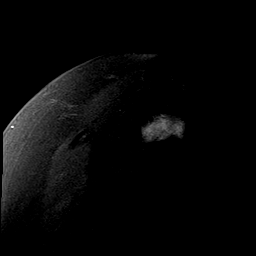
[im 4/16]
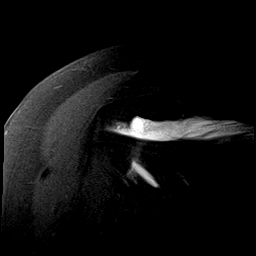
[im 7/16]
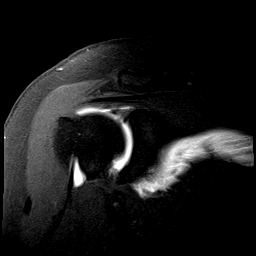
[im 10/16]
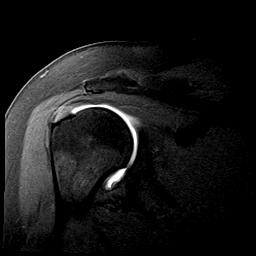
[im 13/16]
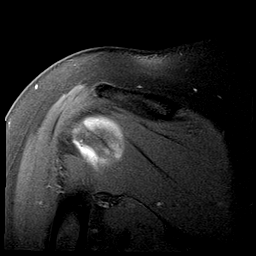
[im 16/16]
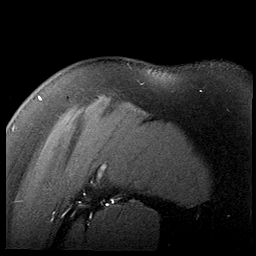

[Series 7: T2 fat-sat · oblique · 4.0mm · 0.55mm/px · 6 of 16 slices shown (2 of 2)]
[im 1/16]
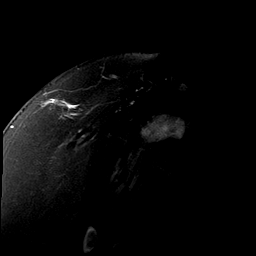
[im 4/16]
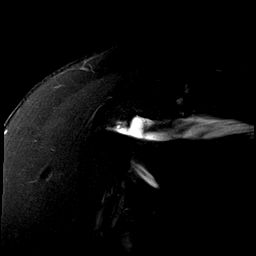
[im 7/16]
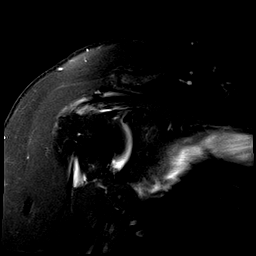
[im 10/16]
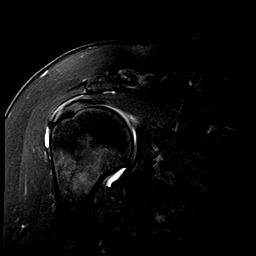
[im 13/16]
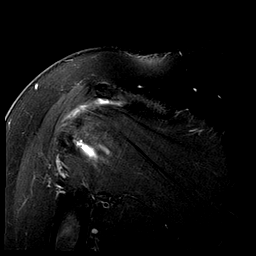
[im 16/16]
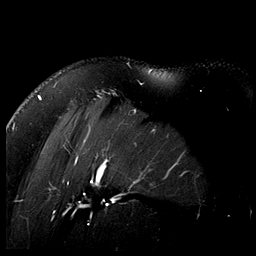

[Series 10: T1 fat-sat · sagittal · 4.0mm · 0.59mm/px · 6 of 16 slices shown (4 of 4)]
[im 1/16]
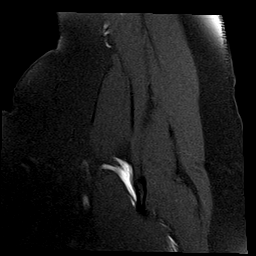
[im 4/16]
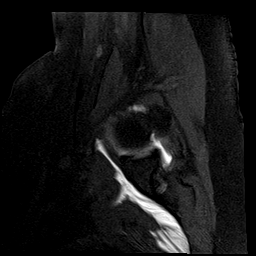
[im 7/16]
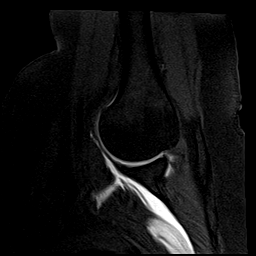
[im 10/16]
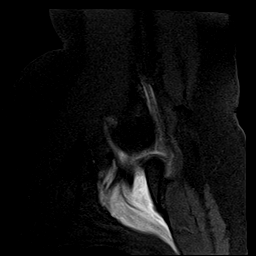
[im 13/16]
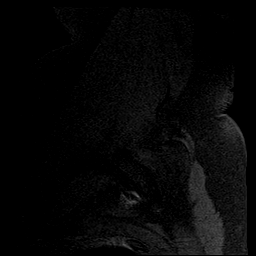
[im 16/16]
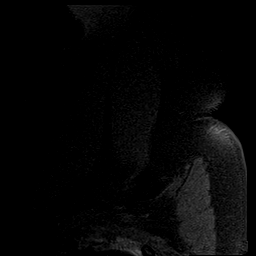

[40 of 40 positions shown; findings below may reference images not displayed]

FINDINGS: Rotator cuff: Moderate tendinosis of the supraspinatus tendon with a
partial-thickness articular surface tear. Mild tendinosis of the
infraspinatus tendon. Subscapularis tendon is intact.

Muscles: No atrophy or fatty replacement of nor abnormal signal
within, the muscles of the rotator cuff.

Biceps long head: Mild tendinosis of the intra-articular portion of
the long head of the biceps tendon.

Acromioclavicular Joint: Mild arthropathy of the acromioclavicular
joint. Type I acromion. Small amount of subacromial/subdeltoid
bursal fluid.

Glenohumeral Joint: Intraarticular contrast distending the joint
capsule. No chondral defect. Normal glenohumeral ligaments.

Labrum: Grossly intact, but evaluation is limited by lack of
intraarticular fluid.

Bones: No acute osseous abnormality.  No aggressive osseous lesion.
IMPRESSION: 1. Moderate tendinosis of the supraspinatus tendon with a
partial-thickness articular surface tear.
2. Mild tendinosis of the infraspinatus tendon.
3. Mild tendinosis of the intra-articular portion of the long head
of the biceps tendon.
4. Mild subacromial/subdeltoid bursitis.

## 2020-04-17 ENCOUNTER — Other Ambulatory Visit: Payer: Self-pay

## 2020-04-21 ENCOUNTER — Encounter: Payer: Self-pay | Admitting: Family Medicine

## 2020-04-21 ENCOUNTER — Other Ambulatory Visit: Payer: Self-pay

## 2020-04-21 ENCOUNTER — Ambulatory Visit (INDEPENDENT_AMBULATORY_CARE_PROVIDER_SITE_OTHER): Payer: BC Managed Care – PPO | Admitting: Internal Medicine

## 2020-04-21 ENCOUNTER — Encounter: Payer: Self-pay | Admitting: Internal Medicine

## 2020-04-21 VITALS — BP 138/64 | HR 82 | Temp 98.0°F | Ht 63.0 in | Wt 199.2 lb

## 2020-04-21 DIAGNOSIS — E042 Nontoxic multinodular goiter: Secondary | ICD-10-CM | POA: Diagnosis not present

## 2020-04-21 DIAGNOSIS — B009 Herpesviral infection, unspecified: Secondary | ICD-10-CM

## 2020-04-21 DIAGNOSIS — R05 Cough: Secondary | ICD-10-CM

## 2020-04-21 DIAGNOSIS — K219 Gastro-esophageal reflux disease without esophagitis: Secondary | ICD-10-CM

## 2020-04-21 DIAGNOSIS — R053 Chronic cough: Secondary | ICD-10-CM

## 2020-04-21 LAB — TSH: TSH: 1.82 u[IU]/mL (ref 0.35–4.50)

## 2020-04-21 LAB — T4, FREE: Free T4: 0.85 ng/dL (ref 0.60–1.60)

## 2020-04-21 NOTE — Patient Instructions (Signed)
-   Please stop by the lab today  

## 2020-04-21 NOTE — Progress Notes (Signed)
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 She has followed with [facility] since 04/22/2019 for consultative assistance with management of her MNG  HISTORICAL SUMMARY:  She has had thyroid nodules for ~ 2012 yrs. This was followed by ENT in [place]. No prior biopsies.  Around 2016 she was on levothyroxine for ~ 1.5 yrs, with normal TFT's but with hypothyroid symptoms. She eventually developed hyperthyroid symptoms and had to stop it.   She is S/P benign FNA of the right inferior nodule in 05/2019   SUBJECTIVE:   During last visit (04/22/2019): TFT's normal   Today (04/22/2020):  Angela [name] is here for a follow up on MNG.   She denies any local neck enlargement  Her weight has decreased  She has chronic constipation  Has occasional  anxiety but no  jittery sensation  She is having cough for a few weeks, has been managed for that by pulmonary     ROS:  As per HPI.   HISTORY:  Past Medical History:  Past Medical History:  Diagnosis Date  . ADHD   . Allergy   . Asthma   . Cancer ([place])    [place]  . Depression   . Fibromyalgia 1999  . GERD (gastroesophageal reflux disease)   . Hx of skin cancer, basal cell 09/2016   L lower eyelid  . Hyperlipidemia   . Metabolic disorder 123456   leaky gut  . Migraines   . Sleep apnea 2015  . Thyroid disease   . Tinnitus, bilateral   . Urine incontinence    Past Surgical History:  Past Surgical History:  Procedure Laterality Date  . ABDOMINAL HYSTERECTOMY     uterus only (bladder sling)  . left knee repair  10/24/2014  . left lower eyelid BCC removal    . PRP left knee  04/03/2015  . rectocele  removed  01/01/2009  . right knee arthroscopy  01/15/2010  . right shoulder removal of bone spurs bursa clean up  03/06/2015    Social History:  reports that she is a non-smoker but has been exposed to tobacco smoke. She has never used smokeless tobacco. She reports current alcohol use. She reports that she does not use drugs. Family History:  Family History  Problem Relation Age of Onset  . Arthritis Mother   . Cancer Mother   . Depression Mother   . Hyperlipidemia Mother   . Hypertension Mother   . Mental illness Mother   . Alcohol abuse Father   . Asthma Father   . COPD Father   . Heart attack Father   . Hyperlipidemia Father   . Hypertension Father   . Stroke Father   . Birth defects Brother   . Asthma Son   . Cancer Maternal Grandmother   . Heart disease Maternal Grandmother   . Cancer Maternal Grandfather   . Cancer Paternal Grandmother   . Alcohol abuse Paternal Grandmother   . Alcohol abuse Brother   . Diabetes Brother   . Learning disabilities Brother   . Cancer Brother      HOME MEDICATIONS: Allergies as of 04/21/2020      Reactions  Caffeine    Heart palpatations   Morphine And Related    Hallucinations      Medication List       Accurate as of April 21, 2020 11:59 PM. If you have any questions, ask your nurse or doctor.        Ambien 5 MG tablet Generic drug: zolpidem Take 5 mg by mouth at bedtime as needed for sleep. 1-2/mo   atorvastatin 20 MG tablet Commonly known as: LIPITOR Take 1 tablet (20 mg total) by mouth daily.   benzonatate 100 MG capsule Commonly known as: TESSALON TAKE 1 TO 2 CAPSULES(100 TO 200 MG) BY MOUTH THREE TIMES DAILY AS NEEDED FOR COUGH   Breo Ellipta 200-25 MCG/INH Aepb Generic drug: fluticasone furoate-vilanterol Inhale 1 puff into the lungs daily.   Breo Ellipta 200-25 MCG/INH Aepb Generic drug: fluticasone furoate-vilanterol Inhale 1 puff into the lungs daily.   chlorpheniramine-HYDROcodone 10-8 MG/5ML  Suer Commonly known as: Tussionex Pennkinetic ER Take 5 mLs by mouth every 12 (twelve) hours as needed for cough.   Deplin 15 15-90.314 MG Caps Take 15 mg by mouth daily.   fluticasone 50 MCG/ACT nasal spray Commonly known as: FLONASE Place into both nostrils daily.   HYDROcodone-homatropine 5-1.5 MG/5ML syrup Commonly known as: HYCODAN Take 5 mLs by mouth every 6 (six) hours as needed for cough.   lisdexamfetamine 30 MG capsule Commonly known as: VYVANSE Take 1 capsule (30 mg total) by mouth daily.   losartan 25 MG tablet Commonly known as: Cozaar Take 1 tablet (25 mg total) by mouth daily.   pramipexole 0.125 MG tablet Commonly known as: Mirapex Take 2 tablets (0.25 mg total) by mouth at bedtime.   pregabalin 50 MG capsule Commonly known as: LYRICA Take 1 capsule (50 mg total) by mouth 3 (three) times daily.   rizatriptan 5 MG tablet Commonly known as: MAXALT TAKE 1 TABLET BY MOUTH AS NEEDED FOR MIGRAINE. MAY REPEAT IN 2 HOURS IF NEEDED   valACYclovir 500 MG tablet Commonly known as: VALTREX Take 1 tablet (500 mg total) by mouth daily.   Xiidra 5 % Soln Generic drug: Lifitegrast Apply 1 drop to eye 2 (two) times daily. Apply to eye.         OBJECTIVE:   PHYSICAL EXAM: VS: BP 138/64 (BP Location: Left Arm, Patient Position: Sitting, Cuff Size: Large)   Pulse 82   Temp 98 F (36.7 C)   Ht 5\' 3"  (1.6 m)   Wt 199 lb 3.2 oz (90.4 kg)   SpO2 98%   BMI 35.29 kg/m    EXAM: General: Pt appears well and is in NAD  Neck: General: Supple without adenopathy. Thyroid: Thyroid size normal. Right nodule appreciated.  Lungs: Clear with good BS bilat with no rales, rhonchi, or wheezes  Heart: Auscultation: RRR.  Abdomen: Normoactive bowel sounds, soft, nontender, without masses or organomegaly palpable  Extremities:  BL LE: No pretibial edema normal ROM and strength.  Skin: Hair: Texture and amount normal with gender appropriate distribution Skin Inspection: No  rashes Skin Palpation: Skin temperature, texture, and thickness normal to palpation  Neuro: Cranial nerves: II - XII grossly intact  Motor: Normal strength throughout DTRs: 2+ and symmetric in UE without delay in relaxation phase  Mental Status: Judgment, insight: Intact Orientation: Oriented to time, place, and person Mood and affect: No depression, anxiety, or agitation     DATA REVIEWED:  Results for PRUDENCIA, PETRELLA (MRN [id number]) as of 04/22/2020 08:08  Ref. Range 04/21/2020 14:51  TSH Latest Ref Range: 0.35 - 4.50 uIU/mL 1.82  T4,Free(Direct) Latest Ref Range: 0.60 - 1.60 ng/dL 0.85     ASSESSMENT / PLAN / RECOMMENDATIONS:   1. Multinodular Goiter:   - Pt is clinically and biochemically euthyroid  - No local neck symptoms  - Will repeat thyroid ultrasound   F/u in 1 yr    Signed electronically by: Mack Guise, MD  [facility]  [address] [place], [place] [address] Phone: [phone] FAX: [phone]      CC: Ronnald Nian, [place] [address] Phone: [phone]  Fax: [phone]   Return to Endocrinology clinic as below: Future Appointments  Date Time Provider [place]  04/27/2020  2:45 PM Marshell Garfinkel, MD [facility] None  04/29/2020  3:15 PM [facility] [name] [facility]  04/23/2021  3:00 PM Calbert Hulsebus, Melanie Crazier, MD [facility] None

## 2020-04-22 ENCOUNTER — Other Ambulatory Visit: Payer: Self-pay | Admitting: Family Medicine

## 2020-04-22 ENCOUNTER — Telehealth: Payer: Self-pay | Admitting: Gastroenterology

## 2020-04-22 NOTE — Telephone Encounter (Signed)
Hi Dr. Bryan Lemma,  We received a referral from PCP for patient to be seen for chronic cough and GERD. Pt has seen pulm, ENT with no clear etiology. Referred to allergy and awaiting appt. In Epic it shows she has a procedure visit at [facility]. Spoke with patient and she states she did not see a GI doctor just went in for the exam.   Please review and advise on scheduling.  Thank you

## 2020-04-24 NOTE — Telephone Encounter (Signed)
Okay to schedule routine OV with me for evaluation of chronic cough, refractory reflux.  Thanks.

## 2020-04-27 ENCOUNTER — Encounter: Payer: Self-pay | Admitting: Pulmonary Disease

## 2020-04-27 ENCOUNTER — Other Ambulatory Visit: Payer: Self-pay

## 2020-04-27 ENCOUNTER — Ambulatory Visit: Payer: BC Managed Care – PPO | Admitting: Pulmonary Disease

## 2020-04-27 VITALS — BP 122/82 | HR 111 | Temp 97.5°F | Ht 63.0 in | Wt 201.8 lb

## 2020-04-27 DIAGNOSIS — R059 Cough, unspecified: Secondary | ICD-10-CM

## 2020-04-27 DIAGNOSIS — R05 Cough: Secondary | ICD-10-CM | POA: Diagnosis not present

## 2020-04-27 NOTE — Progress Notes (Signed)
Angela [name]    [id number]    November 07, 1961  Primary Care Physician:Cirigliano, Garvin Fila, DO  Referring Physician: Ronnald Nian, DO [place],  [place] [address]  Chief complaint: Follow up for cough  HPI: 59 year old with history of hypertension, childhood asthma, hyperlipidemia, skin cancer, allergies, sleep apnea, fibromyalgia, dermatographia, chronic fatigue  Complains of persistent cough, nonproductive in nature for the past several months.  Denies any fevers, chills.  She has been tested negative for Covid on 10/29/2019.  Recent CT chest shows minimal groundglass opacities and has been referred to pulmonary for further evaluation.  She has been started on lisinopril in Jan 2021 for hypertension but her cough has preceded initiation of this medication. 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  She is working with a holistic functional medicine doctor in [place] and had organic acid test of gut microbiota which showed increased Aspergillus.  Pets: No pets Occupation: Retired Marine scientist, Education officer, museum Exposures: Reports some mold in her travel.  No hot tub, Jacuzzi, humidifier.  No feather pillows or comforters Smoking history: Never smoked Travel history: Originally from [place].  Has been living in [place] for 2 to 3 years Relevant family history: No significant family history of lung disease  Interim history: Continues to have cough but is less productive and less intense. Has followed up with ENT with no abnormalities on examination.  Is awaiting GI eval She has also tried PPI for acid reflux without significant relief.  Evaluated by Dr. [name], rheumatology for elevated ANA.  Additional tests were sent and patient was told there is no evidence of autoimmune process.  Outpatient Encounter Medications as of 04/27/2020  Medication Sig  . atorvastatin (LIPITOR) 20 MG tablet Take 1 tablet (20  mg total) by mouth daily.  . chlorpheniramine-HYDROcodone (TUSSIONEX PENNKINETIC ER) 10-8 MG/5ML SUER Take 5 mLs by mouth every 12 (twelve) hours as needed for cough.  . fluticasone (FLONASE) 50 MCG/ACT nasal spray Place into both nostrils daily.  . fluticasone furoate-vilanterol (BREO ELLIPTA) 200-25 MCG/INH AEPB Inhale 1 puff into the lungs daily.  Marland Kitchen L-Methylfolate-Algae (DEPLIN 15) 15-90.314 MG CAPS Take 15 mg by mouth daily.  Marland Kitchen lisdexamfetamine (VYVANSE) 30 MG capsule Take 1 capsule (30 mg total) by mouth daily.  Marland Kitchen losartan (COZAAR) 25 MG tablet Take 1 tablet (25 mg total) by mouth daily.  . pramipexole (MIRAPEX) 0.125 MG tablet Take 2 tablets (0.25 mg total) by mouth at bedtime.  . pregabalin (LYRICA) 50 MG capsule Take 1 capsule (50 mg total) by mouth 3 (three) times daily.  . rizatriptan (MAXALT) 5 MG tablet TAKE 1 TABLET BY MOUTH AS NEEDED FOR MIGRAINE. MAY REPEAT IN 2 HOURS IF NEEDED  . valACYclovir (VALTREX) 500 MG tablet Take 1 tablet (500 mg total) by mouth daily.  Marland Kitchen XIIDRA 5 % SOLN INSTILL 1 DROP IN AFFECTED EYE(S) TWICE DAILY  . zolpidem (AMBIEN) 5 MG tablet Take 5 mg by mouth at bedtime as needed for sleep. 1-2/mo   No facility-administered encounter medications on file as of 04/27/2020.   Physical Exam: Blood pressure 122/82, pulse (!) 111, temperature (!) 97.5 F (36.4 C), temperature source Temporal, height 5\' 3"  (1.6 m), weight 201 lb 12.8 oz (91.5 kg), SpO2 96 %. Gen:      No acute distress HEENT:  EOMI, sclera anicteric Neck:     No masses; no thyromegaly Lungs:    Clear to auscultation bilaterally; normal  respiratory effort CV:         Regular rate and rhythm; no murmurs Abd:      + bowel sounds; soft, non-tender; no palpable masses, no distension Ext:    No edema; adequate peripheral perfusion Skin:      Warm and dry; no rash Neuro: alert and oriented x 3 Psych: normal mood and affect  Data Reviewed: Imaging: High-resolution CT 12/27/2019-no interstitial lung  disease.  Very minimal groundglass attenuation with mild air trapping.  Tiny 2 to 3 mm lung nodules.  Aortic, coronary atherosclerosis. I have reviewed the images personally.  PFTs: 04/06/2020 FVC 1.95 [61%], FEV1 1.78 [72%], F/F 91, SVC 2.38 [74%], DLCO 23.68 [121%] Mild restriction.  Labs: CBC 01/27/2020-WBC 4.8, eos 2.7%, absolute eosinophil count 130 Respiratory allergy profile 01/27/2020-IgE-3, RAST panel negative ANA 01/27/2020-1:320, nucleolar homogeneous Hypersensitivity panel, rheumatoid factor, CCP 01/27/2020-negative  Assessment:  Chronic cough, mild asthma I have reviewed the high-resolution CT with no convincing evidence of interstitial lung disease.  There is minimal groundglass with air trapping which may be from asthma, reactive airway disease.  She does have history of childhood asthma and allergies  PFTs reviewed with no overt obstruction but she does have air trapping and increase in mid flows suggestive of small airways disease She is due to get an allergy evaluation Continue Breo  Plan/Recommendations: - Continue Breo inhaler - Follow-up high-res CT  Marshell Garfinkel MD [facility] 04/27/2020, 2:58 PM  CC: Ronnald Nian, DO

## 2020-04-27 NOTE — Patient Instructions (Signed)
We will get a repeat high-resolution CT for evaluation of her lungs given persistent cough and to make sure that the groundglass opacities have improved Follow-up in 1 to 2 months.

## 2020-04-28 ENCOUNTER — Encounter: Payer: Self-pay | Admitting: Gastroenterology

## 2020-04-29 ENCOUNTER — Ambulatory Visit
Admission: RE | Admit: 2020-04-29 | Discharge: 2020-04-29 | Disposition: A | Discharge status: Home / Self Care | Payer: BC Managed Care – PPO | Source: Ambulatory Visit | Attending: Internal Medicine | Admitting: Internal Medicine

## 2020-04-29 DIAGNOSIS — E041 Nontoxic single thyroid nodule: Secondary | ICD-10-CM | POA: Diagnosis not present

## 2020-04-29 DIAGNOSIS — E042 Nontoxic multinodular goiter: Secondary | ICD-10-CM

## 2020-04-29 DIAGNOSIS — R059 Cough, unspecified: Secondary | ICD-10-CM

## 2020-04-29 DIAGNOSIS — R05 Cough: Secondary | ICD-10-CM

## 2020-04-30 ENCOUNTER — Encounter: Payer: Self-pay | Admitting: Internal Medicine

## 2020-05-01 ENCOUNTER — Telehealth: Payer: Self-pay | Admitting: Internal Medicine

## 2020-05-01 NOTE — Telephone Encounter (Signed)
Discussed ultrasound results with the pt on 05/01/2020 as below    Nodule # 1:  Prior biopsy: No  Location: Right; Superior  Maximum size: 2.7 cm; Other 2 dimensions: 2.1 x 1.5 cm, previously, 1.7 x 1.1 x 0.8 cm  Composition: mixed cystic and solid (1)  Echogenicity: isoechoic (1)  Shape: not taller-than-wide (0)  Margins: ill-defined (0)  Echogenic foci: none (0)  ACR TI-RADS total points: 2.  ACR TI-RADS risk category:  TR2 (2 points).  Significant change in size (>/= 20% in two dimensions and minimal increase of 2 mm): Yes - size differences attributable to interval cystic degeneration, a typically benign finding  Change in features: Yes nodule previously appeared predominantly solid, now mixed cystic and solid  Change in ACR TI-RADS risk category: Yes - previously characterized as a TR3 nodule, currently a TR2 nodule.  ACR TI-RADS recommendations:  This nodule does NOT meet TI-RADS criteria for biopsy or dedicated follow-up.  _________________________________________________________  Previously biopsied approximately 1.8 x 1.3 x 1.1 cm hypoechoic ill-defined nodule/pseudonodule within the inferior pole the right lobe of the thyroid (labeled 2), is unchanged compared to the 02/2019 examination, previously, 1.8 1.2 x 1.0 cm. Correlation with previous biopsy results is advised.  _________________________________________________________  Nodule # 3:  Prior biopsy: No  Location: Right; Inferior  Maximum size: 1.7 cm; Other 2 dimensions: 1.1 x 1.0 cm, previously, 1.8 x 1.0 x 1.0 cm  Composition: solid/almost completely solid (2)  Echogenicity: hypoechoic (2)  Shape: not taller-than-wide (0)  Margins: ill-defined (0)  Echogenic foci: none (0)  ACR TI-RADS total points: 4.  ACR TI-RADS risk category:  TR4 (4-6 points).  Significant change in size (>/= 20% in two dimensions and minimal increase of 2 mm): No  Change  in features: No  Change in ACR TI-RADS risk category: No  ACR TI-RADS recommendations:  *Given size (>/= 1 - 1.4 cm) and appearance, a follow-up ultrasound in 1 year should be considered based on TI-RADS criteria.  _________________________________________________________  The approximately 0.7 cm hypoechoic nodule with the mid, posterior aspect the left lobe of the thyroid is unchanged compared to the 02/2019 examination, previously, 0.7 cm, and again does not meet criteria to recommend percutaneous sampling or continued dedicated follow-up.  IMPRESSION: 1. Similar findings of multinodular goiter. No worrisome new or enlarging thyroid nodules. 2. Previously biopsied right-sided thyroid nodule (presently labeled #2), is unchanged compared to the 02/2019 examination. Assuming a benign pathologic diagnosis, repeat sampling and/or continued dedicated follow-up is not recommended. 3. Nodule #1 has undergone interval cystic degeneration, a typically benign finding, and as such has been down graded from a TR3 nodule to a TR2 nodule, and thus no longer meets imaging criteria to recommend continued dedicated follow-up. 4. Nodule #3 is unchanged compared to the 02/2019 examination though again meets imaging criteria to recommend annual/biannual surveillance. This examination documents 14 months of stability. Follow-up examination in 02/2024 would ensure 5 years of stability and thus a benign etiology     Will repeat thyroid ultrasound in 1 yr but the pt encouraged to contact us with any local neck symptoms in the interim  Pt expressed understanding   Abby Nena Jordan, MD  [facility] Endocrinology  [facility] [address] [place], [place] [address] Phone: [phone] FAX: [phone]

## 2020-05-05 ENCOUNTER — Other Ambulatory Visit: Payer: Self-pay | Admitting: Family Medicine

## 2020-05-05 DIAGNOSIS — Z1231 Encounter for screening mammogram for malignant neoplasm of breast: Secondary | ICD-10-CM

## 2020-05-08 ENCOUNTER — Ambulatory Visit
Admission: RE | Admit: 2020-05-08 | Discharge: 2020-05-08 | Disposition: A | Discharge status: Home / Self Care | Payer: BC Managed Care – PPO | Source: Ambulatory Visit | Attending: Pulmonary Disease | Admitting: Pulmonary Disease

## 2020-05-08 ENCOUNTER — Other Ambulatory Visit: Payer: Self-pay

## 2020-05-08 DIAGNOSIS — R918 Other nonspecific abnormal finding of lung field: Secondary | ICD-10-CM | POA: Diagnosis not present

## 2020-05-08 DIAGNOSIS — R059 Cough, unspecified: Secondary | ICD-10-CM

## 2020-05-11 ENCOUNTER — Other Ambulatory Visit: Payer: Self-pay

## 2020-05-11 ENCOUNTER — Ambulatory Visit
Admission: RE | Admit: 2020-05-11 | Discharge: 2020-05-11 | Disposition: A | Discharge status: Home / Self Care | Payer: BC Managed Care – PPO | Source: Ambulatory Visit | Attending: Family Medicine | Admitting: Family Medicine

## 2020-05-11 DIAGNOSIS — Z1231 Encounter for screening mammogram for malignant neoplasm of breast: Secondary | ICD-10-CM | POA: Diagnosis not present

## 2020-05-11 NOTE — Telephone Encounter (Signed)
Dr. Mannam, please see pt's mychart message and advise. °

## 2020-05-12 ENCOUNTER — Encounter: Payer: Self-pay | Admitting: Family Medicine

## 2020-05-12 NOTE — Telephone Encounter (Signed)
I called the phone number but could not get through The CT scan read does not confirm that she has COVID-19 or hypersensitivity pneumonitis The radiologist is just putting that out as possibilities.  This will need to be correlated with other clinical information.  Agree that the opacities are minimal and stable compared to before.  We can discuss in detail at return clinic visit. Please make appointment

## 2020-05-13 ENCOUNTER — Other Ambulatory Visit: Payer: Self-pay | Admitting: Family Medicine

## 2020-05-13 ENCOUNTER — Encounter: Payer: Self-pay | Admitting: Family Medicine

## 2020-05-13 DIAGNOSIS — R928 Other abnormal and inconclusive findings on diagnostic imaging of breast: Secondary | ICD-10-CM

## 2020-05-17 ENCOUNTER — Encounter: Payer: Self-pay | Admitting: Family Medicine

## 2020-05-20 ENCOUNTER — Other Ambulatory Visit: Payer: Self-pay

## 2020-05-20 ENCOUNTER — Ambulatory Visit
Admission: RE | Admit: 2020-05-20 | Discharge: 2020-05-20 | Disposition: A | Discharge status: Home / Self Care | Payer: BC Managed Care – PPO | Source: Ambulatory Visit | Attending: Family Medicine | Admitting: Family Medicine

## 2020-05-20 DIAGNOSIS — N6489 Other specified disorders of breast: Secondary | ICD-10-CM | POA: Diagnosis not present

## 2020-05-20 DIAGNOSIS — N6012 Diffuse cystic mastopathy of left breast: Secondary | ICD-10-CM | POA: Diagnosis not present

## 2020-05-20 DIAGNOSIS — R928 Other abnormal and inconclusive findings on diagnostic imaging of breast: Secondary | ICD-10-CM

## 2020-05-22 ENCOUNTER — Other Ambulatory Visit: Payer: Self-pay | Admitting: Pulmonary Disease

## 2020-05-22 DIAGNOSIS — R059 Cough, unspecified: Secondary | ICD-10-CM

## 2020-05-22 MED ORDER — BREO ELLIPTA 200-25 MCG/INH IN AEPB: 1.0000 | INHALATION_SPRAY | Freq: Every day | RESPIRATORY_TRACT | 5 refills | Status: DC

## 2020-06-11 ENCOUNTER — Encounter: Payer: Self-pay | Admitting: Allergy & Immunology

## 2020-06-11 ENCOUNTER — Other Ambulatory Visit: Payer: Self-pay

## 2020-06-11 ENCOUNTER — Ambulatory Visit (INDEPENDENT_AMBULATORY_CARE_PROVIDER_SITE_OTHER): Payer: BC Managed Care – PPO | Admitting: Allergy & Immunology

## 2020-06-11 ENCOUNTER — Other Ambulatory Visit: Payer: Self-pay | Admitting: Allergy & Immunology

## 2020-06-11 VITALS — BP 150/82 | HR 80 | Temp 97.7°F | Resp 16 | Ht 63.0 in | Wt 201.6 lb

## 2020-06-11 DIAGNOSIS — J3089 Other allergic rhinitis: Secondary | ICD-10-CM

## 2020-06-11 DIAGNOSIS — R059 Cough, unspecified: Secondary | ICD-10-CM

## 2020-06-11 DIAGNOSIS — K9049 Malabsorption due to intolerance, not elsewhere classified: Secondary | ICD-10-CM

## 2020-06-11 DIAGNOSIS — R05 Cough: Secondary | ICD-10-CM

## 2020-06-11 DIAGNOSIS — J302 Other seasonal allergic rhinitis: Secondary | ICD-10-CM | POA: Diagnosis not present

## 2020-06-11 NOTE — Patient Instructions (Addendum)
1. Seasonal and perennial allergic rhinitis - Testing today showed: grasses and indoor molds - Copy of test results provided.  - Avoidance measures provided. - Continue with: Mucinex twice daily as you are doing, Flonase (fluticasone) one spray per nostril daily and Astelin (azelastine) 2 sprays per nostril 1-2 times daily as needed - Start taking: nasal saline rinses twice daily (saline will thin the mucous and help the Mucinex to work better) Angela [name] can use an extra dose of the antihistamine, if needed, for breakthrough symptoms.  - Consider nasal saline rinses 1-2 times daily to remove allergens from the nasal cavities as well as help with mucous clearance (this is especially helpful to do before the nasal sprays are given)  2. Cough - Lung testing looked fairly good today. - Continue to follow with Dr. Rolla Etienne.  3. Return in about 6 months (around 12/11/2020).    Please inform us of any Emergency Department visits, hospitalizations, or changes in symptoms. Call us before going to the ED for breathing or allergy symptoms since we might be able to fit you in for a sick visit. Feel free to contact us anytime with any questions, problems, or concerns.  It was a pleasure to meet you today!  Websites that have reliable patient information: 1. American Academy of Asthma, Allergy, and Immunology: www.aaaai.org 2. Food Allergy Research and Education (FARE): foodallergy.org 3. Mothers of Asthmatics: http://www.asthmacommunitynetwork.org 4. American College of Allergy, Asthma, and Immunology: www.acaai.org   COVID-19 Vaccine Information can be found at: [email or web address].[email or web address].uk For questions related to vaccine distribution or appointments, please email [email or web address] or call [phone].     "Like" [name] on Facebook and Instagram for our latest updates!        Make sure you are registered to vote! If you have moved or changed any  of your contact information, you will need to get this updated before voting!  In some cases, you MAY be able to register to vote online: [email or web address]    Reducing Pollen Exposure  The American Academy of Allergy, Asthma and Immunology suggests the following steps to reduce your exposure to pollen during allergy seasons.    1. Do not hang sheets or clothing out to dry; pollen may collect on these items. 2. Do not mow lawns or spend time around freshly cut grass; mowing stirs up pollen. 3. Keep windows closed at night.  Keep car windows closed while driving. 4. Minimize morning activities outdoors, a time when pollen counts are usually at their highest. 5. Stay indoors as much as possible when pollen counts or humidity is high and on windy days when pollen tends to remain in the air longer. 6. Use air conditioning when possible.  Many air conditioners have filters that trap the pollen spores. 7. Use a HEPA room air filter to remove pollen form the indoor air you breathe.  Control of Mold Allergen   Mold and fungi can grow on a variety of surfaces provided certain temperature and moisture conditions exist.  Outdoor molds grow on plants, decaying vegetation and soil.  The major outdoor mold, Alternaria and Cladosporium, are found in very high numbers during hot and dry conditions.  Generally, a late Summer - Fall peak is seen for common outdoor fungal spores.  Rain will temporarily lower outdoor mold spore count, but counts rise rapidly when the rainy period ends.  The most important indoor molds are Aspergillus and Penicillium.  Dark, humid and poorly ventilated basements are ideal sites for mold growth.  The next  most common sites of mold growth are the bathroom and the kitchen.   Indoor (Perennial) Mold Control   Positive indoor molds via skin testing: Aspergillus, Penicillium, Fusarium, Aureobasidium ([place]) and Rhizopus  1. Maintain humidity below  50%. 2. Clean washable surfaces with 5% bleach solution. 3. Remove sources e.g. contaminated carpets.    You can buy saline mix at a pharmacy, or you can make your own saline solution:  1. Add 1 cup (240 mL) distilled water to a clean container. If you use tap water, boil it first to sterilize it, and then let it cool until it is lukewarm.  2. Add 0.5 tsp (2.5 g) salt to the water. 3. Add 0.5 tsp (2.5 g) baking soda.

## 2020-06-11 NOTE — Progress Notes (Signed)
NEW PATIENT  Date of Service/Encounter:  06/11/20  Referring provider: Ronnald Nian, DO   Assessment:   Seasonal and perennial allergic rhinitis (grasses and indoor molds)  Cough - multifactorial with partial improvement with cessation of ACE inhibitor and initiation of Breo   Prefers holistic medication  Complicated past medical history, including fibromyalgia   Plan/Recommendations:   1. Seasonal and perennial allergic rhinitis - Testing today showed: grasses and indoor molds - Copy of test results provided.  - Avoidance measures provided. - Continue with: Mucinex twice daily as you are doing, Flonase (fluticasone) one spray per nostril daily and Astelin (azelastine) 2 sprays per nostril 1-2 times daily as needed - Start taking: nasal saline rinses twice daily (saline will thin the mucous and help the Mucinex to work better) [employer] can use an extra dose of the antihistamine, if needed, for breakthrough symptoms.  - Consider nasal saline rinses 1-2 times daily to remove allergens from the nasal cavities as well as help with mucous clearance (this is especially helpful to do before the nasal sprays are given)  2. Cough - Lung testing looked fairly good today. - Continue to follow with Dr. Rolla Etienne.  3. Follow up in 6 months or earlier if needed.    Total of 60 minutes, greater than 50% of which was spent in discussion of treatment and management options.    Subjective:   Angela [name] is a 59 y.o. 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  Angela [name] was referred by Ronnald Nian, DO.     Angela [name] is a 59 y.o. female presenting for an evaluation of a cough. She has had a cough for around one year. It has been on and off since June 2020. She stopped her lisinopril earlier this year. This was part of the issue but the cough has continued. It is more wet at this point.   She has been sleeping on multiple pillows with awakening -- coughing -- throwing up -- choking. She reports "super thick" postnasal drip. This would result in her throwing up as she drifted off to sleep.   She worked with pulmonology first and has seen ENT and Rheumatologist.   Of note, she has [place] Fever from her stay in [place]. She had a CXR and a couple of CTs that showed ground glass opacities. She is unsure whether these ground glass opacities are from the [facility] Fever or something else. She is unclear of that.   Chest (May 2021):  1. Mild mid and lower lung zone predominant peripheral ground-glass, similar to minimally increased from 01/02/2020. Differential diagnosis includes scarring from prior COVID-19 pneumonia as well as mild chronic hypersensitivity pneumonitis. 2. Air trapping is indicative of small airways disease. 3. Pulmonary nodules measure 4 mm or less in size, unchanged. No follow-up needed if patient is low-risk (and has no known or suspected primary neoplasm). Non-contrast chest CT can be considered in 12 months  if patient is high-risk. This recommendation follows the consensus statement: Guidelines for Management of Incidental Pulmonary Nodules Detected on CT Images: From the Fleischner Society 2017; Radiology 2017; 284:228-243. 4. Cholelithiasis. 5.  Aortic atherosclerosis (ICD10-I70.0).  She had pulmonary function testing performed in April 2021.  Results shown below.    Interpretation:  Although the FEV1 and FVC are reduced, the FEV1/FVC ratio is increased. The FVC is reduced relative to the SVC indicating air trapping. The slow vital capacity is reduced. Following administration of bronchodilators, there is no significant response. The diffusing capacity is normal. However, the diffusing capacity was not corrected for the patient's hemoglobin.   Asthma/Respiratory Symptom History: She is on the Breo one puff once daily and albuterol as needed. It is unclear whether she carries a diagnosis of asthma, but she is followed by Dr. Rolla Etienne in Pulmonology. She reports today that the coughing likely messed up her spirometry today.   Allergic Rhinitis Symptom History: She is on a nasal spray from the ENT. She was positive to some things in [place] when she lived there in the [employer]. She does not remember exactly what was positive. She was tested in 2015 for dermatographism and this was negative.  She had blood testing that was negative to environmental panel.   Food Allergy Symptom History: She tells me that she has a history of multiple food allergies. She reports that her skin becomes prickly like "1000 ants" when she is exposed to gluten, nightshades, and tomatoes. She is wanting food allergy testing and cannot really narrow it down. She has never had anaphylaxis from this.   She did see Rheumatology since there was concern about sarcoidosis. She saw [name], All of her autoimmune workup was "not lupus". She does have fibromyalgia which she has had for years.   Otherwise, there is no history of other atopic diseases, including food allergies, drug allergies, stinging insect allergies, eczema, urticaria or contact dermatitis. There is no significant infectious history. Vaccinations are up to date.    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 If you have any questions, ask your nurse or doctor.        STOP taking these medications   chlorpheniramine-HYDROcodone 10-8 MG/5ML Suer Commonly known as: Tussionex Pennkinetic ER Stopped by: Valentina Shaggy, MD     TAKE these medications   Ambien 5 MG tablet Generic drug: zolpidem Take 5 mg by mouth at bedtime as needed for sleep. 1-2/mo   atorvastatin 20 MG tablet Commonly known as: LIPITOR Take 1 tablet (20 mg total) by mouth daily.   azelastine 0.1 % nasal spray Commonly known as: ASTELIN Place 1 spray into both nostrils 2 (two) times daily.   Breo Ellipta 200-25 MCG/INH Aepb Generic drug: fluticasone furoate-vilanterol Inhale 1 puff into the lungs daily.   Deplin 15 15-90.314  MG Caps Take 15 mg by mouth daily.   fluticasone 50 MCG/ACT nasal spray Commonly known as: FLONASE Place into both nostrils daily.   lisdexamfetamine 30 MG capsule Commonly known as: VYVANSE Take 1 capsule (30 mg total) by mouth daily.   losartan 25 MG tablet Commonly known as: Cozaar Take 1 tablet (25 mg total) by mouth daily.   pramipexole 0.125 MG tablet Commonly known as: Mirapex Take 2 tablets (0.25 mg total) by mouth at bedtime.   pregabalin 50 MG capsule Commonly known as: LYRICA Take 1 capsule (50 mg total) by mouth 3 (three) times daily.   rizatriptan 5 MG tablet Commonly known as: MAXALT TAKE 1 TABLET BY MOUTH AS NEEDED FOR MIGRAINE. MAY REPEAT IN 2 HOURS IF NEEDED   valACYclovir 500 MG tablet Commonly known as:  VALTREX Take 1 tablet (500 mg total) by mouth daily.   Xiidra 5 % Soln Generic drug: Lifitegrast INSTILL 1 DROP IN AFFECTED EYE(S) TWICE DAILY       Birth History: non-contributory  Developmental History: non-contributory  Past Surgical History: Past Surgical History:  Procedure Laterality Date   ABDOMINAL HYSTERECTOMY     uterus only (bladder sling)   left knee repair  10/24/2014   left lower eyelid BCC removal     PRP left knee  04/03/2015   rectocele removed  01/01/2009   right knee arthroscopy  01/15/2010   right shoulder removal of bone spurs bursa clean up  03/06/2015     Family History: Family History  Problem Relation Age of Onset   Arthritis Mother    Cancer Mother    Depression Mother    Hyperlipidemia Mother    Hypertension Mother    Mental illness Mother    Alcohol abuse Father    Asthma Father    COPD Father    Heart attack Father    Hyperlipidemia Father    Hypertension Father    Stroke Father    Birth defects Brother    Asthma Son    Cancer Maternal Grandmother    Heart disease Maternal Grandmother    Cancer Maternal Grandfather    Cancer Paternal Grandmother    Alcohol abuse Paternal Grandmother    Alcohol abuse Brother    Diabetes Brother    Learning disabilities Brother    Cancer Brother      Social History: Jalyssa lives at home in a house    ROS     Objective:   Blood pressure (!) 150/82, pulse 80, temperature 97.7 F (36.5 C), temperature source Temporal, resp. rate 16, height 5\' 3"  (1.6 m), weight 201 lb 9.6 oz (91.4 kg), SpO2 97 %. Body mass index is 35.71 kg/m.   Physical Exam:   Physical Exam   Diagnostic studies:    Spirometry: results normal (FEV1: 1.73/70%, FVC: 2.11/66%, FEV1/FVC: 82%).    Spirometry consistent with possible restrictive disease.    Allergy Studies:     Airborne Adult Perc - 06/11/20 1628    Time Antigen Placed 1628    Allergen Manufacturer Lavella Hammock     Location Back    Number of Test 59    Panel 1 Select    1. Control-Buffer 50% Glycerol Negative    2. Control-Histamine 1 mg/ml 3+    3. Albumin saline Negative    4. [place] Negative    5. [place] Negative    6. Johnson Negative    7. Gila Blue Negative    8. Meadow Fescue Negative    9.  Perennial Rye Negative    10. Sweet Vernal Negative    11. Timothy Negative    12. Cocklebur Negative    13. Burweed Marshelder Negative    14. Ragweed, short Negative    15. Ragweed, Giant Negative    16. Plantain,  English Negative    17. Lamb's Quarters Negative    18. Sheep Sorrell Negative    19. Rough Pigweed Negative    20. Marsh Elder, Rough Negative    21. Mugwort, Common Negative    22. Ash mix Negative    23. Birch mix Negative    24. Beech American Negative    25. Box, Elder Negative    26. Cedar, red Negative    27. Cottonwood, [place] Negative    28. Elm mix Negative    29. Hickory Negative    30. Maple mix Negative    31. Oak, [place] mix Negative    32. Pecan Pollen Negative    33. Pine mix Negative    34. Sycamore Eastern Negative    35. [place], Black Pollen Negative    36. Alternaria alternata Negative    37. Cladosporium Herbarum Negative    38. Aspergillus mix Negative    39. Penicillium mix Negative    40. Bipolaris sorokiniana (Helminthosporium) Negative    41. Drechslera spicifera (Curvularia) Negative    42. Mucor plumbeus Negative    43. Fusarium moniliforme Negative    44. Aureobasidium pullulans (pullulara) Negative    45. Rhizopus oryzae Negative    46. Botrytis cinera Negative    47. Epicoccum nigrum Negative    48. Phoma betae Negative    49. Candida Albicans Negative    50. Trichophyton mentagrophytes Negative    51. Mite, D Farinae  5,000 AU/ml Negative    52. Mite, D Pteronyssinus  5,000 AU/ml Negative    53. Cat Hair 10,000 BAU/ml Negative    54.  Dog Epithelia Negative    55. Mixed Feathers Negative    56. Horse Epithelia Negative    57.  Cockroach, German Negative    58. Mouse Negative    59. Tobacco Leaf Negative          Intradermal - 06/11/20 1628    Time Antigen Placed 1628    Allergen Manufacturer Lavella Hammock    Location Arm    Number of Test 15    Intradermal Select    Control Negative    Guatemala Negative    Johnson 2+    7 Grass Negative    Ragweed mix Negative    Weed mix Negative    Tree mix Negative    Mold 1 Negative    Mold 2 1+    Mold 3 Negative    Mold 4 1+    Cat Negative    Dog Negative    Cockroach Negative    Mite mix Negative          Food Adult Perc - 06/11/20 1600    Time Antigen Placed 1628    Allergen Manufacturer Greer    Location Back    Number of allergen test 72    1. Peanut Negative    2. Soybean Negative    3. Wheat Negative    4. Sesame Negative    5. Milk, cow Negative    6. Egg White, Chicken Negative    7. Casein Negative    8. Shellfish Mix Negative    9. Fish Mix Negative    10. Cashew  Negative    11. Pecan Food Negative    12. Pine Valley Negative    13. Almond Negative    14. Hazelnut Negative    15. Bol[place] nut Negative    16. Coconut Negative    17. Pistachio Negative    18. Catfish Negative    19. Bass Negative    20. Trout Negative    21. Tuna Negative    22. Salmon Negative    23. Flounder Negative    24. Codfish Negative    25. Shrimp Negative    26. Crab Negative    27. Lobster Negative    28. Oyster Negative    29. Scallops Negative    30. Barley Negative    31. Oat  Negative    32. Rye  Negative    33. Hops Negative    34. Rice Negative    35. Cottonseed Negative    36. Saccharomyces Cerevisiae  Negative    37. Pork Negative    38. [place] Meat Negative    39. Chicken Meat Negative    40. Beef Negative    41. Lamb Negative    42. Tomato Negative    43. White Potato Negative    44. Sweet Potato Negative    45. Pea, Green/English Negative    46. Navy Bean Negative    47. Mushrooms Negative    48. Avocado Negative    49. Onion  Negative    50. Cabbage Negative    51. Carrots Negative    52. Celery Negative    53. Corn Negative    54. Cucumber Negative    55. Grape (White seedless) Negative    56. Orange  Negative    57. Banana Negative    58. Apple Negative    59. Peach Negative    60. Strawberry Negative    61. Cantaloupe Negative    62. Watermelon Negative    63. Pineapple Negative    64. Chocolate/Cacao bean Negative    65. Karaya Gum Negative    66. Acacia (Arabic Gum) Negative    67. Cinnamon Negative    68. Nutmeg Negative    69. Ginger Negative    70. Garlic Negative    71. Pepper, black Negative    72. Mustard Negative           Allergy testing results were read and interpreted by myself, documented by clinical staff.         Salvatore Marvel, MD [facility]

## 2020-06-12 ENCOUNTER — Other Ambulatory Visit: Payer: Self-pay

## 2020-06-12 MED ORDER — FLUTICASONE PROPIONATE 50 MCG/ACT NA SUSP
1.0000 | Freq: Every day | NASAL | 5 refills | Status: AC | PRN
Start: 2020-06-12 — End: ?

## 2020-06-16 ENCOUNTER — Encounter: Payer: Self-pay | Admitting: Allergy & Immunology

## 2020-06-18 ENCOUNTER — Ambulatory Visit (INDEPENDENT_AMBULATORY_CARE_PROVIDER_SITE_OTHER): Payer: BC Managed Care – PPO | Admitting: Gastroenterology

## 2020-06-18 ENCOUNTER — Encounter: Payer: Self-pay | Admitting: Gastroenterology

## 2020-06-18 VITALS — BP 130/70 | HR 84 | Temp 97.0°F | Ht 63.0 in | Wt 201.0 lb

## 2020-06-18 DIAGNOSIS — R197 Diarrhea, unspecified: Secondary | ICD-10-CM | POA: Diagnosis not present

## 2020-06-18 DIAGNOSIS — R140 Abdominal distension (gaseous): Secondary | ICD-10-CM

## 2020-06-18 DIAGNOSIS — R194 Change in bowel habit: Secondary | ICD-10-CM

## 2020-06-18 DIAGNOSIS — R05 Cough: Secondary | ICD-10-CM

## 2020-06-18 DIAGNOSIS — Z86010 Personal history of colonic polyps: Secondary | ICD-10-CM

## 2020-06-18 DIAGNOSIS — R1110 Vomiting, unspecified: Secondary | ICD-10-CM | POA: Diagnosis not present

## 2020-06-18 DIAGNOSIS — R053 Chronic cough: Secondary | ICD-10-CM

## 2020-06-18 DIAGNOSIS — K921 Melena: Secondary | ICD-10-CM

## 2020-06-18 DIAGNOSIS — Z800 Family history of malignant neoplasm of digestive organs: Secondary | ICD-10-CM

## 2020-06-18 NOTE — Progress Notes (Signed)
Chief Complaint: Chronic cough, GERD, increased belching, RUQ pain, diarrhea  Referring Provider:     Ronnald Nian, DO   HPI:     Angela [name] is a 59 y.o. female with a history of HTN, childhood asthma, hyperlipidemia, skin cancer, sleep apnea, fibromyalgia, multinodular goiter, dermatographia, chronic fatigue, seasonal/allergic rhinitis referred to the [facility] for evaluation of chronic cough and possible reflux symptoms.  Index symptoms of chronic cough with mucus, hoarseness,   Some improvement with the below interventions. Has tried sleeping with HOB elevated.   Wakes up frequently with belching followed by hiccups. +nocturnal choking sensation. No dysphagia.   Was started on omeprazole a couple months ago, taking QPM. Increased belching, but otherwise decrease in nocturnal sxs.   Separately with increased abdominal bloating and abdominal distension. Sxs worse with gluten, sometimes dairy, night shade. Also with cramping abdominal pain followed by diarrhea, all new in the last 7 months or so, occurring every 3-4 weeks or so (lasting 1-2 days at a time).   Longer standing hx of constipation, with previous rectocele repair. Does have episodes of hematochezia.   No prior EGD.   Mother and MGM with colon cancer.   For chronic cough, has been evaluated in the Pulmonary Clinic, [facility], and ENT.  Has trialed PPI without significant change.  Takes Breo inhaler and started on medications for allergic rhinitis.  Has trialed Flonase, Astelin nasal spray, saline flushes, antihistamines.  Evaluation includes laryngoscopy, spirometry, high-resolution CT, PFT.  Has had colonoscopy x3 due to FHx, with last in 05/2018 in [place] and [place] for polyps each time, and told to repeat in 3 years.  No endoscopy records available for review.  Past Medical History:  Diagnosis Date  . ADHD   . Allergy   . Asthma   . Cancer ([place])    [place]  . Depression   .  Fibromyalgia 1999  . GERD (gastroesophageal reflux disease)   . Hx of skin cancer, basal cell 09/2016   L lower eyelid  . Hyperlipidemia   . Metabolic disorder 0940   leaky gut  . Migraines   . Sleep apnea 2015  . Thyroid disease   . Tinnitus, bilateral   . Urine incontinence      Past Surgical History:  Procedure Laterality Date  . ABDOMINAL HYSTERECTOMY     uterus only (bladder sling)  . left knee repair  10/24/2014  . left lower eyelid BCC removal    . PRP left knee  04/03/2015  . rectocele removed  01/01/2009  . right knee arthroscopy  01/15/2010  . right shoulder removal of bone spurs bursa clean up  03/06/2015   Family History  Problem Relation Age of Onset  . Arthritis Mother   . Cancer Mother   . Depression Mother   . Hyperlipidemia Mother   . Hypertension Mother   . Mental illness Mother   . Alcohol abuse Father   . Asthma Father   . COPD Father   . Heart attack Father   . Hyperlipidemia Father   . Hypertension Father   . Stroke Father   . Birth defects Brother   . Asthma Son   . Cancer Maternal Grandmother   . Heart disease Maternal Grandmother   . Cancer Maternal Grandfather   . Cancer Paternal Grandmother   . Alcohol abuse Paternal Grandmother   . Alcohol abuse Brother   . Diabetes Brother   .  Learning disabilities Brother   . Cancer Brother    Social History   Tobacco Use  . Smoking status: Passive Smoke Exposure - Never Smoker  . Smokeless tobacco: Never Used  Vaping Use  . Vaping Use: Never used  Substance Use Topics  . Alcohol use: Yes    Comment: social  . Drug use: Never   Current Outpatient Medications  Medication Sig Dispense Refill  . atorvastatin (LIPITOR) 20 MG tablet Take 1 tablet (20 mg total) by mouth daily. 90 tablet 3  . azelastine (ASTELIN) 0.1 % nasal spray Use 2 spray per nostril 1-2 times daily as needed 30 mL 5  . fluticasone (FLONASE) 50 MCG/ACT nasal spray Place 1 spray into both nostrils daily as needed for  allergies or rhinitis. 1 g 5  . fluticasone furoate-vilanterol (BREO ELLIPTA) 200-25 MCG/INH AEPB Inhale 1 puff into the lungs daily. 14 each 5  . L-Methylfolate-Algae (DEPLIN 15) 15-90.314 MG CAPS Take 15 mg by mouth daily. 90 capsule 3  . lisdexamfetamine (VYVANSE) 30 MG capsule Take 1 capsule (30 mg total) by mouth daily. 30 capsule 0  . losartan (COZAAR) 25 MG tablet Take 1 tablet (25 mg total) by mouth daily. 90 tablet 3  . pramipexole (MIRAPEX) 0.125 MG tablet Take 2 tablets (0.25 mg total) by mouth at bedtime. 180 tablet 3  . pregabalin (LYRICA) 50 MG capsule Take 1 capsule (50 mg total) by mouth 3 (three) times daily. 270 capsule 3  . rizatriptan (MAXALT) 5 MG tablet TAKE 1 TABLET BY MOUTH AS NEEDED FOR MIGRAINE. MAY REPEAT IN 2 HOURS IF NEEDED 10 tablet 2  . valACYclovir (VALTREX) 500 MG tablet Take 1 tablet (500 mg total) by mouth daily. 90 tablet 3  . XIIDRA 5 % SOLN INSTILL 1 DROP IN AFFECTED EYE(S) TWICE DAILY 180 each 0  . zolpidem (AMBIEN) 5 MG tablet Take 5 mg by mouth at bedtime as needed for sleep. 1-2/mo     No current facility-administered medications for this visit.   Allergies  Allergen Reactions  . Caffeine     Heart palpatations  . Morphine And Related     Hallucinations      Review of Systems: All systems reviewed and negative except where noted in HPI.     Physical Exam:    Wt Readings from Last 3 Encounters:  06/18/20 201 lb (91.2 kg)  06/11/20 201 lb 9.6 oz (91.4 kg)  04/27/20 201 lb 12.8 oz (91.5 kg)    BP 130/70 (BP Location: Left Arm, Patient Position: Sitting, Cuff Size: Normal)   Pulse 84   Temp (!) 97 F (36.1 C)   Ht 5\' 3"  (1.6 m)   Wt 201 lb (91.2 kg)   BMI 35.61 kg/m  Constitutional:  Pleasant, in no acute distress. Psychiatric: Normal mood and affect. Behavior is normal. EENT: Pupils normal.  Conjunctivae are normal. No scleral icterus. Neck supple. No cervical LAD. Cardiovascular: Normal rate, regular rhythm. No  edema Pulmonary/chest: Effort normal and breath sounds normal. No wheezing, rales or rhonchi. Abdominal: Soft, nondistended, nontender. Bowel sounds active throughout. There are no masses palpable. No hepatomegaly. Neurological: Alert and oriented to person place and time. Skin: Skin is warm and dry. No rashes noted.   ASSESSMENT AND PLAN;   1) Chronic cough 2) Waterbrash 3) Belching/Regurgitation  Discussed pathophysiology of reflux at length today, to include typical and atypical reflux symptoms.  Some response to omeprazole 40 mg/day along with dietary/lifestyle modifications.  Given ongoing symptoms, duration of  symptoms, and worsening symptoms lately, plan for endoscopic evaluation  -EGD to assess for erosive esophagitis, LES laxity, hiatal hernia -Resume omeprazole as currently prescribed -Continue lifestyle/dietary modifications  4) Abdominal bloating 5) Abdominal distention 6) Diarrhea 7) Change in bowel habits 8) Hematochezia  -Gastric and duodenal biopsies -Colonoscopy with random and directed biopsies -If clinically significant hemorrhoids, may evaluate for either hemorrhoid banding vs referral to colorectal surgery given prior history of rectocele repair -Discussed low FODMAP diet provided with handout and instruction today  9) Cholelithiasis: -GB stones noted on CT chest.  Otherwise normal-appearing GB and no duct dilation.  Unclear clinically significant, but will watch for now.  Can always consider RUQ [name] and/for HIDA as appropriate  10) Family history of colon cancer 11) Personal history of colon polyps -Family history of CRC with mother and MGM.  Reported personal history of polyps-unknown size, number, histology, location -Can certainly perform polyp surveillance/elevated risk CRC screening at time of colonoscopy as above   The indications, risks, and benefits of EGD and colonoscopy were explained to the patient in detail. Risks include but are not limited to  bleeding, perforation, adverse reaction to medications, and cardiopulmonary compromise. Sequelae include but are not limited to the possibility of surgery, hositalization, and mortality. The patient verbalized understanding and wished to proceed. All questions answered, referred to scheduler and bowel prep ordered. Further recommendations pending results of the exam.    Lavena Bullion, DO, FACG  06/18/2020, 2:47 PM   Symphony Demuro, Garvin Fila, DO

## 2020-06-18 NOTE — Patient Instructions (Addendum)
You have been scheduled for an endoscopy and colonoscopy. Please follow the written instructions given to you at your visit today. Please pick up your prep supplies at the pharmacy within the next 1-3 days. If you use inhalers (even only as needed), please bring them with you on the day of your procedure. Your physician has requested that you go to www.[email or web address].com and enter the access code given to you at your visit today. This web site gives a general overview about your procedure. However, you should still follow specific instructions given to you by our office regarding your preparation for the procedure.  Low FODMAP Diet: (Fermentable Oligosaccharides, Disaccharides, Monosaccharides, and Polyols) These are short chain carbohydrates and sugar alcohols that are poorly absorbed by the body, resulting in multiple abdominal symptoms, including changes in bowel habits, abdominal pain/discomfort, bloating, abdominal distension, gas, etc.      It was a pleasure to see you today!  Vito Cirigliano, D.O.

## 2020-06-23 ENCOUNTER — Telehealth: Payer: Self-pay | Admitting: Family Medicine

## 2020-06-23 NOTE — Telephone Encounter (Signed)
Angela [name] is calling and wanted to know if Angela [name] replaced Angela [name]. Informed pharmacy that Dr. Loletha [name] is out of the office until next week and question will be sent to the doctor of the day, please advise. CB is [phone]

## 2020-06-24 ENCOUNTER — Ambulatory Visit: Payer: BC Managed Care – PPO | Admitting: Pulmonary Disease

## 2020-06-24 NOTE — Telephone Encounter (Signed)
[facility] was contacted and informed that Losartan did replace lisinopril due to pt cough.

## 2020-07-02 ENCOUNTER — Encounter: Payer: Self-pay | Admitting: Gastroenterology

## 2020-07-12 ENCOUNTER — Other Ambulatory Visit: Payer: Self-pay | Admitting: Family Medicine

## 2020-07-13 NOTE — Telephone Encounter (Signed)
Last OV 06/18/20 Last fill 04/23/20  #180/0

## 2020-07-15 ENCOUNTER — Encounter: Payer: Self-pay | Admitting: Gastroenterology

## 2020-07-15 ENCOUNTER — Ambulatory Visit (AMBULATORY_SURGERY_CENTER): Payer: BC Managed Care – PPO | Admitting: Gastroenterology

## 2020-07-15 VITALS — BP 146/77 | HR 84 | Temp 98.9°F | Resp 14 | Ht 63.0 in | Wt 201.0 lb

## 2020-07-15 DIAGNOSIS — D124 Benign neoplasm of descending colon: Secondary | ICD-10-CM

## 2020-07-15 DIAGNOSIS — K921 Melena: Secondary | ICD-10-CM | POA: Diagnosis not present

## 2020-07-15 DIAGNOSIS — K2980 Duodenitis without bleeding: Secondary | ICD-10-CM | POA: Diagnosis not present

## 2020-07-15 DIAGNOSIS — K2950 Unspecified chronic gastritis without bleeding: Secondary | ICD-10-CM | POA: Diagnosis not present

## 2020-07-15 DIAGNOSIS — R1110 Vomiting, unspecified: Secondary | ICD-10-CM

## 2020-07-15 DIAGNOSIS — K6389 Other specified diseases of intestine: Secondary | ICD-10-CM | POA: Diagnosis not present

## 2020-07-15 DIAGNOSIS — K648 Other hemorrhoids: Secondary | ICD-10-CM

## 2020-07-15 DIAGNOSIS — R194 Change in bowel habit: Secondary | ICD-10-CM | POA: Diagnosis not present

## 2020-07-15 DIAGNOSIS — K640 First degree hemorrhoids: Secondary | ICD-10-CM

## 2020-07-15 DIAGNOSIS — K2970 Gastritis, unspecified, without bleeding: Secondary | ICD-10-CM

## 2020-07-15 DIAGNOSIS — R05 Cough: Secondary | ICD-10-CM | POA: Diagnosis not present

## 2020-07-15 DIAGNOSIS — Z86010 Personal history of colonic polyps: Secondary | ICD-10-CM

## 2020-07-15 DIAGNOSIS — K5730 Diverticulosis of large intestine without perforation or abscess without bleeding: Secondary | ICD-10-CM

## 2020-07-15 DIAGNOSIS — R140 Abdominal distension (gaseous): Secondary | ICD-10-CM

## 2020-07-15 DIAGNOSIS — R197 Diarrhea, unspecified: Secondary | ICD-10-CM

## 2020-07-15 DIAGNOSIS — R053 Chronic cough: Secondary | ICD-10-CM

## 2020-07-15 DIAGNOSIS — Z800 Family history of malignant neoplasm of digestive organs: Secondary | ICD-10-CM

## 2020-07-15 DIAGNOSIS — D125 Benign neoplasm of sigmoid colon: Secondary | ICD-10-CM

## 2020-07-15 DIAGNOSIS — K635 Polyp of colon: Secondary | ICD-10-CM

## 2020-07-15 MED ORDER — SODIUM CHLORIDE 0.9 % IV SOLN: 500.0000 mL | Freq: Once | INTRAVENOUS | Status: AC

## 2020-07-15 NOTE — Progress Notes (Signed)
SS- Front Desk CM- VS

## 2020-07-15 NOTE — Patient Instructions (Addendum)
YOU HAD AN ENDOSCOPIC PROCEDURE TODAY AT [facility]:   Refer to the procedure report that was given to you for any specific questions about what was found during the examination.  If the procedure report does not answer your questions, please call your gastroenterologist to clarify.  If you requested that your care partner not be given the details of your procedure findings, then the procedure report has been included in a sealed envelope for you to review at your convenience later.  YOU SHOULD EXPECT: Some feelings of bloating in the abdomen. Passage of more gas than usual.  Walking can help get rid of the air that was put into your GI tract during the procedure and reduce the bloating. If you had a lower endoscopy (such as a colonoscopy or flexible sigmoidoscopy) you may notice spotting of blood in your stool or on the toilet paper. If you underwent a bowel prep for your procedure, you may not have a normal bowel movement for a few days.  Please Note:  You might notice some irritation and congestion in your nose or some drainage.  This is from the oxygen used during your procedure.  There is no need for concern and it should clear up in a day or so.  SYMPTOMS TO REPORT IMMEDIATELY:   Following lower endoscopy (colonoscopy or flexible sigmoidoscopy):  Excessive amounts of blood in the stool  Significant tenderness or worsening of abdominal pains  Swelling of the abdomen that is new, acute  Fever of 100F or higher   Following upper endoscopy (EGD)  Vomiting of blood or coffee ground material  New chest pain or pain under the shoulder blades  Painful or persistently difficult swallowing  New shortness of breath  Fever of 100F or higher  Black, tarry-looking stools  For urgent or emergent issues, a gastroenterologist can be reached at any hour by calling [phone]. Do not use MyChart messaging for urgent concerns.    DIET:  We do recommend a small meal at first, but  then you may proceed to your regular diet.  Drink plenty of fluids but you should avoid alcoholic beverages for 24 hours.  MEDICATIONS: Continue present medications. Resume Omeprazole as previously prescribed. Use fiber, for example, Citrucel, Fibercon, Konsyl or Metamucil.  FOLLOW UP: Return to Dr. Vivia Ewing office for an appointment in 3 months. If your hemorrhoids continue to cause you discomfort, and if you are interested in further treatment of these hemorrhoids with band ligation, please contact Dr. Vivia Ewing office to set up an appointment for evaluation and treatment.  Please see handouts given to you by your recovery nurse.  ACTIVITY:  You should plan to take it easy for the rest of today and you should NOT DRIVE or use heavy machinery until tomorrow (because of the sedation medicines used during the test).    FOLLOW UP: Our staff will call the number listed on your records 48-72 hours following your procedure to check on you and address any questions or concerns that you may have regarding the information given to you following your procedure. If we do not reach you, we will leave a message.  We will attempt to reach you two times.  During this call, we will ask if you have developed any symptoms of COVID 19. If you develop any symptoms (ie: fever, flu-like symptoms, shortness of breath, cough etc.) before then, please call [phone].  If you test positive for Covid 19 in the 2 weeks post procedure, please call  and report this information to [name].    If any biopsies were taken you will be contacted by phone or by letter within the next 1-3 weeks.  Please call us at [phone] if you have not heard about the biopsies in 3 weeks.   Thank you for allowing [name] to provide for your healthcare needs today.   SIGNATURES/CONFIDENTIALITY: You and/or your care partner have signed paperwork which will be entered into your electronic medical record.  These signatures attest to the fact  that that the information above on your After Visit Summary has been reviewed and is understood.  Full responsibility of the confidentiality of this discharge information lies with you and/or your care-partner.

## 2020-07-15 NOTE — Op Note (Signed)
[facility] Patient Name: Angela [name] Procedure Date: 07/15/2020 1:23 PM MRN: [id number] Endoscopist: Gerrit Heck , MD Age: 59 Referring MD:  Date of Birth: 05/10/1961 Gender: Female Account #: [id number] Procedure:                Colonoscopy Indications:              Hematochezia, Change in bowel habits, Constipation,                            Diarrhea                           Last colonoscopy was at outside facility ~2018 and                            notable for polyps (no details re: size, location,                            number, histology). Family history notable for                            mother and MGM with CRC. Medicines:                Monitored Anesthesia Care Procedure:                Pre-Anesthesia Assessment:                           - Prior to the procedure, a History and Physical                            was performed, and patient medications and                            allergies were reviewed. The patient's tolerance of                            previous anesthesia was also reviewed. The risks                            and benefits of the procedure and the sedation                            options and risks were discussed with the patient.                            All questions were answered, and informed consent                            was obtained. Prior Anticoagulants: The patient has                            taken no previous anticoagulant or antiplatelet  agents. ASA Grade Assessment: III - A patient with                            severe systemic disease. After reviewing the risks                            and benefits, the patient was deemed in                            satisfactory condition to undergo the procedure.                           After obtaining informed consent, the colonoscope                            was passed under direct vision. Throughout the                             procedure, the patient's blood pressure, pulse, and                            oxygen saturations were monitored continuously. The                            Colonoscope was introduced through the anus and                            advanced to the the cecum, identified by                            appendiceal orifice and ileocecal valve. The                            colonoscopy was performed without difficulty. The                            patient tolerated the procedure well. The quality                            of the bowel preparation was good. The ileocecal                            valve, appendiceal orifice, and rectum were                            photographed. Scope In: 1:47:20 PM Scope Out: 2:06:07 PM Scope Withdrawal Time: 0 hours 14 minutes 14 seconds  Total Procedure Duration: 0 hours 18 minutes 47 seconds  Findings:                 The perianal and digital rectal examinations were                            normal.  Two sessile polyps were found in the sigmoid colon                            and descending colon. The polyps were 2 to 3 mm in                            size. These polyps were removed with a cold biopsy                            forceps. Resection and retrieval were complete.                            Estimated blood loss was minimal.                           A few small-mouthed diverticula were found in the                            ascending colon.                           Normal mucosa was found in the entire colon.                            Biopsies for histology were taken with a cold                            forceps from the right colon and left colon for                            evaluation of microscopic colitis. Estimated blood                            loss was minimal.                           Non-bleeding internal hemorrhoids were found during                            retroflexion. The  hemorrhoids were small. Complications:            No immediate complications. Estimated Blood Loss:     Estimated blood loss was minimal. Impression:               - Two 2 to 3 mm polyps in the sigmoid colon and in                            the descending colon, removed with a cold biopsy                            forceps. Resected and retrieved.                           - Diverticulosis in the ascending colon.                           -  Normal mucosa in the entire examined colon.                            Biopsied.                           - Non-bleeding internal hemorrhoids. Recommendation:           - Patient has a contact number available for                            emergencies. The signs and symptoms of potential                            delayed complications were discussed with the                            patient. Return to normal activities tomorrow.                            Written discharge instructions were provided to the                            patient.                           - Resume previous diet.                           - Continue present medications.                           - Await pathology results.                           - Repeat colonoscopy in 5 years for surveillance                            and due to family history and prior history of                            polyps.                           - Return to GI clinic in 3 months.                           - Use fiber, for example Citrucel, Fibercon, Konsyl                            or Metamucil.                           - Internal hemorrhoids were noted on this study and                            may be amenable to hemorrhoid band ligation.  If                            hemorrhoids continue to be symptomatic, and if you                            are interested in further treatment of these                            hemorrhoids with band ligation, please contact my                             clinic to set up an appointment for evaluation and                            treatment. Gerrit Heck, MD 07/15/2020 2:19:05 PM

## 2020-07-15 NOTE — Progress Notes (Signed)
pt tolerated well. VSS. awake and to recovery. Report given to RN. Bite block inserted and removed without trauma. 

## 2020-07-15 NOTE — Progress Notes (Signed)
Called to room to assist during endoscopic procedure.  Patient ID and intended procedure confirmed with present staff. Received instructions for my participation in the procedure from the performing physician.  

## 2020-07-15 NOTE — Op Note (Signed)
[facility] Patient Name: Angela [name] Procedure Date: 07/15/2020 1:23 PM MRN: [id number] Endoscopist: Gerrit Heck , MD Age: 59 Referring MD:  Date of Birth: 1961-12-19 Gender: Female Account #: [id number] Procedure:                Upper GI endoscopy Indications:              Suspected esophageal reflux, Abdominal distention,                            Abdominal bloating, Chronic cough, Eructation,                            Change in bowel habits Medicines:                Monitored Anesthesia Care Procedure:                Pre-Anesthesia Assessment:                           - Prior to the procedure, a History and Physical                            was performed, and patient medications and                            allergies were reviewed. The patient's tolerance of                            previous anesthesia was also reviewed. The risks                            and benefits of the procedure and the sedation                            options and risks were discussed with the patient.                            All questions were answered, and informed consent                            was obtained. Prior Anticoagulants: The patient has                            taken no previous anticoagulant or antiplatelet                            agents. ASA Grade Assessment: III - A patient with                            severe systemic disease. After reviewing the risks                            and benefits, the patient was deemed in  satisfactory condition to undergo the procedure.                           After obtaining informed consent, the endoscope was                            passed under direct vision. Throughout the                            procedure, the patient's blood pressure, pulse, and                            oxygen saturations were monitored continuously. The                            Endoscope was introduced  through the mouth, and                            advanced to the second part of duodenum. The upper                            GI endoscopy was accomplished without difficulty.                            The patient tolerated the procedure well. Scope In: Scope Out: Findings:                 The examined esophagus was normal.                           The Z-line was regular and was found 40 cm from the                            incisors.                           Localized mild inflammation characterized by                            erythema was found in the gastric body. Biopsies                            were taken with a cold forceps for Helicobacter                            pylori testing. Estimated blood loss was minimal.                           The gastric fundus, incisura, gastric antrum and                            pylorus were normal. Biopsies were taken with a                            cold forceps for  Helicobacter pylori testing.                            Estimated blood loss was minimal.                           The duodenal bulb, first portion of the duodenum                            and second portion of the duodenum were normal.                            Biopsies for histology were taken with a cold                            forceps for evaluation of celiac disease. Estimated                            blood loss was minimal. Complications:            No immediate complications. Estimated Blood Loss:     Estimated blood loss was minimal. Impression:               - Normal esophagus.                           - Z-line regular, 40 cm from the incisors.                           - Gastritis. Biopsied.                           - Normal gastric fundus, incisura, antrum and                            pylorus. Biopsied.                           - Normal duodenal bulb, first portion of the                            duodenum and second portion of the duodenum.                             Biopsied. Recommendation:           - Patient has a contact number available for                            emergencies. The signs and symptoms of potential                            delayed complications were discussed with the                            patient. Return to normal activities tomorrow.  Written discharge instructions were provided to the                            patient.                           - Resume previous diet.                           - Continue present medications.                           - Resume omeprazole as previously prescribed.                           - Await pathology results.                           - Return to GI clinic in 3 months.                           - Colonoscopy today. Gerrit Heck, MD 07/15/2020 2:13:29 PM

## 2020-07-17 ENCOUNTER — Telehealth: Payer: Self-pay | Admitting: *Deleted

## 2020-07-17 ENCOUNTER — Telehealth: Payer: Self-pay

## 2020-07-17 NOTE — Telephone Encounter (Signed)
  Follow up Call-  Call back number 07/15/2020  Post procedure Call Back phone  # [phone] cell  Permission to leave phone message Yes  Some recent data might be hidden     Patient questions:  Do you have a fever, pain , or abdominal swelling? No. Pain Score  0 *  Have you tolerated food without any problems? Yes.    Have you been able to return to your normal activities? Yes.    Do you have any questions about your discharge instructions: Diet   No. Medications  No. Follow up visit  No.  Do you have questions or concerns about your Care? No.  Actions: * If pain score is 4 or above: No action needed, pain <4.  1. Have you developed a fever since your procedure? no  2.   Have you had an respiratory symptoms (SOB or cough) since your procedure? no  3.   Have you tested positive for COVID 19 since your procedure? No   4.   Have you had any family members/close contacts diagnosed with the COVID 19 since your procedure?  No    If yes to any of these questions please route to Joylene John, RN and Erenest Rasher, RN

## 2020-07-17 NOTE — Telephone Encounter (Signed)
First attempt, left VM.  

## 2020-07-23 ENCOUNTER — Encounter: Payer: Self-pay | Admitting: Gastroenterology

## 2020-08-09 ENCOUNTER — Encounter: Payer: Self-pay | Admitting: Family Medicine

## 2020-08-09 DIAGNOSIS — F988 Other specified behavioral and emotional disorders with onset usually occurring in childhood and adolescence: Secondary | ICD-10-CM

## 2020-08-12 MED ORDER — LISDEXAMFETAMINE DIMESYLATE 30 MG PO CAPS: 30.0000 mg | ORAL_CAPSULE | Freq: Every day | ORAL | 0 refills | Status: DC

## 2020-08-12 NOTE — Telephone Encounter (Signed)
Rx refilled but pt needs appt in next 30 days/before next refill. pts on controlled meds need q29mo appts. Thanks!

## 2020-10-28 ENCOUNTER — Encounter: Payer: Self-pay | Admitting: Family Medicine

## 2020-10-28 ENCOUNTER — Ambulatory Visit (INDEPENDENT_AMBULATORY_CARE_PROVIDER_SITE_OTHER): Payer: BC Managed Care – PPO | Admitting: Family Medicine

## 2020-10-28 ENCOUNTER — Other Ambulatory Visit: Payer: Self-pay

## 2020-10-28 VITALS — BP 146/82 | HR 90 | Temp 99.0°F | Ht 63.25 in | Wt 210.0 lb

## 2020-10-28 DIAGNOSIS — E7800 Pure hypercholesterolemia, unspecified: Secondary | ICD-10-CM

## 2020-10-28 DIAGNOSIS — B009 Herpesviral infection, unspecified: Secondary | ICD-10-CM

## 2020-10-28 DIAGNOSIS — M797 Fibromyalgia: Secondary | ICD-10-CM

## 2020-10-28 DIAGNOSIS — R059 Cough, unspecified: Secondary | ICD-10-CM

## 2020-10-28 DIAGNOSIS — R7301 Impaired fasting glucose: Secondary | ICD-10-CM | POA: Diagnosis not present

## 2020-10-28 DIAGNOSIS — E042 Nontoxic multinodular goiter: Secondary | ICD-10-CM | POA: Diagnosis not present

## 2020-10-28 DIAGNOSIS — F988 Other specified behavioral and emotional disorders with onset usually occurring in childhood and adolescence: Secondary | ICD-10-CM

## 2020-10-28 DIAGNOSIS — E559 Vitamin D deficiency, unspecified: Secondary | ICD-10-CM

## 2020-10-28 DIAGNOSIS — I10 Essential (primary) hypertension: Secondary | ICD-10-CM

## 2020-10-28 DIAGNOSIS — Z0000 Encounter for general adult medical examination without abnormal findings: Secondary | ICD-10-CM | POA: Diagnosis not present

## 2020-10-28 DIAGNOSIS — R058 Other specified cough: Secondary | ICD-10-CM

## 2020-10-28 DIAGNOSIS — G2581 Restless legs syndrome: Secondary | ICD-10-CM

## 2020-10-28 DIAGNOSIS — G4700 Insomnia, unspecified: Secondary | ICD-10-CM

## 2020-10-28 MED ORDER — LOSARTAN POTASSIUM 25 MG PO TABS: 25.0000 mg | ORAL_TABLET | Freq: Every day | ORAL | 3 refills | Status: AC

## 2020-10-28 MED ORDER — PRAMIPEXOLE DIHYDROCHLORIDE 0.125 MG PO TABS: 0.2500 mg | ORAL_TABLET | Freq: Every day | ORAL | 3 refills | Status: AC

## 2020-10-28 MED ORDER — BREO ELLIPTA 200-25 MCG/INH IN AEPB: 1.0000 | INHALATION_SPRAY | Freq: Every day | RESPIRATORY_TRACT | 5 refills | Status: AC

## 2020-10-28 MED ORDER — HYDROCOD POLST-CPM POLST ER 10-8 MG/5ML PO SUER: 5.0000 mL | Freq: Every evening | ORAL | 0 refills | Status: AC | PRN

## 2020-10-28 MED ORDER — PREGABALIN 50 MG PO CAPS: 50.0000 mg | ORAL_CAPSULE | Freq: Three times a day (TID) | ORAL | 3 refills | Status: AC

## 2020-10-28 MED ORDER — ATORVASTATIN CALCIUM 20 MG PO TABS: 20.0000 mg | ORAL_TABLET | Freq: Every day | ORAL | 3 refills | Status: AC

## 2020-10-28 MED ORDER — AZELASTINE HCL 0.1 % NA SOLN: NASAL | 5 refills | Status: AC

## 2020-10-28 MED ORDER — VALACYCLOVIR HCL 500 MG PO TABS: 500.0000 mg | ORAL_TABLET | Freq: Every day | ORAL | 3 refills | Status: AC

## 2020-10-28 MED ORDER — LISDEXAMFETAMINE DIMESYLATE 30 MG PO CAPS: 30.0000 mg | ORAL_CAPSULE | Freq: Every day | ORAL | 0 refills | Status: AC

## 2020-10-28 MED ORDER — ZOLPIDEM TARTRATE 5 MG PO TABS: 5.0000 mg | ORAL_TABLET | Freq: Every evening | ORAL | 0 refills | Status: AC | PRN

## 2020-10-28 MED ORDER — XIIDRA 5 % OP SOLN: OPHTHALMIC | 0 refills | Status: DC

## 2020-10-28 NOTE — Progress Notes (Signed)
Angela [name] is a 59 y.o. female  Chief Complaint  Patient presents with  . Annual Exam    CPE/labs.  not fasting.   will wait on flu till next week.  pt actively had a cough, nasal drainage and HA x 1 week.  refills on Breo, Xiidra, Losartan, Valtrex, Ambien    HPI: Angela [name] is a 59 y.o. female seen today for annual CPE, labs. She is not fasting for labs and will RTO for labs and flu vaccine.   Last PAP:11/2018 - due in 11/2021 Last mammo: 04/2020 Last colonoscopy: 06/2020 - due in 06/2025 - Dr. Gerrit Heck w/ [facility] Thyroid US: 04/2020  Dental: due Vision: due  Med refills needed today? All meds - see orders   Past Medical History:  Diagnosis Date  . ADHD   . Allergy   . Asthma    Breo  . Cancer ([place])    [place]  . Depression   . Dermatographia   . Fibromyalgia 1999  . GERD (gastroesophageal reflux disease)   . Hx of skin cancer, basal cell 09/2016   L lower eyelid  . Hyperlipidemia   . Hypertension   . Metabolic disorder 3976   leaky gut  . Migraines   . Sleep apnea 2015  . Thyroid disease    Thyroid cycts  . Tinnitus, bilateral   . Urine incontinence     Past Surgical History:  Procedure Laterality Date  . ABDOMINAL HYSTERECTOMY     uterus only (bladder sling)  . COLONOSCOPY    . left knee repair  10/24/2014  . left lower eyelid BCC removal    . PRP left knee  04/03/2015  . rectocele removed  01/01/2009  . right knee arthroscopy  01/15/2010  . right shoulder removal of bone spurs bursa clean up  03/06/2015    Social History   Socioeconomic History  . Marital status: Divorced    Spouse name: Not on file  . Number of children: Not on file  . Years of education: Not on file  . Highest education level: Not on file  Occupational History  . Not on file  Tobacco Use  . Smoking status: Passive Smoke Exposure - Never Smoker  . Smokeless tobacco: Never Used  Vaping Use  . Vaping Use: Never used  Substance and Sexual Activity  .  Alcohol use: Yes    Comment: social  . Drug use: Never  . Sexual activity: Not on file  Other Topics Concern  . Not on file  Social History Narrative  . Not on file   Social Determinants of Health   Financial Resource Strain:   . Difficulty of Paying Living Expenses: Not on file  Food Insecurity:   . Worried About Charity fundraiser in the Last Year: Not on file  . Ran Out of Food in the Last Year: Not on file  Transportation Needs:   . Lack of Transportation (Medical): Not on file  . Lack of Transportation (Non-Medical): Not on file  Physical Activity:   . Days of Exercise per Week: Not on file  . Minutes of Exercise per Session: Not on file  Stress:   . Feeling of Stress : Not on file  Social Connections:   . Frequency of Communication with Friends and Family: Not on file  . Frequency of Social Gatherings with Friends and Family: Not on file  . Attends Religious Services: Not on file  . Active Member of Clubs or Organizations: Not  on file  . Attends Archivist Meetings: Not on file  . Marital Status: Not on file  Intimate Partner Violence:   . Fear of Current or Ex-Partner: Not on file  . Emotionally Abused: Not on file  . Physically Abused: Not on file  . Sexually Abused: Not on file    Family History  Problem Relation Age of Onset  . Arthritis Mother   . Cancer Mother   . Depression Mother   . Hyperlipidemia Mother   . Hypertension Mother   . Mental illness Mother   . Colon cancer Mother 50       mother dx with colon cancer  . Alcohol abuse Father   . Asthma Father   . COPD Father   . Heart attack Father   . Hyperlipidemia Father   . Hypertension Father   . Stroke Father   . Birth defects Brother   . Asthma Son   . Cancer Maternal Grandmother   . Heart disease Maternal Grandmother   . Colon cancer Maternal Grandmother 87       dx with colon cancer  . Cancer Maternal Grandfather   . Cancer Paternal Grandmother   . Alcohol abuse Paternal  Grandmother   . Alcohol abuse Brother   . Diabetes Brother   . Learning disabilities Brother   . Cancer Brother   . Esophageal cancer Neg Hx   . Rectal cancer Neg Hx   . Stomach cancer Neg Hx      Immunization History  Administered Date(s) Administered  . Influenza,inj,Quad PF,6+ Mos 10/23/2018, 10/03/2019  . Influenza-Unspecified 10/23/2018  . Moderna SARS-COVID-2 Vaccination 03/01/2020, 03/29/2020  . Tdap 03/13/2019  . Zoster Recombinat (Shingrix) 05/02/2018, 10/23/2018    Outpatient Encounter Medications as of 10/28/2020  Medication Sig  . atorvastatin (LIPITOR) 20 MG tablet Take 1 tablet (20 mg total) by mouth daily.  Marland Kitchen azelastine (ASTELIN) 0.1 % nasal spray Use 2 spray per nostril 1-2 times daily as needed  . fluticasone (FLONASE) 50 MCG/ACT nasal spray Place 1 spray into both nostrils daily as needed for allergies or rhinitis.  . fluticasone furoate-vilanterol (BREO ELLIPTA) 200-25 MCG/INH AEPB Inhale 1 puff into the lungs daily.  . Lifitegrast (XIIDRA) 5 % SOLN INSTILL 1 DROP IN AFFECTED EYE(S) TWICE DAILY  . lisdexamfetamine (VYVANSE) 30 MG capsule Take 1 capsule (30 mg total) by mouth daily.  Marland Kitchen losartan (COZAAR) 25 MG tablet Take 1 tablet (25 mg total) by mouth daily.  . pramipexole (MIRAPEX) 0.125 MG tablet Take 2 tablets (0.25 mg total) by mouth at bedtime.  . pregabalin (LYRICA) 50 MG capsule Take 1 capsule (50 mg total) by mouth 3 (three) times daily.  . rizatriptan (MAXALT) 5 MG tablet TAKE 1 TABLET BY MOUTH AS NEEDED FOR MIGRAINE. MAY REPEAT IN 2 HOURS IF NEEDED  . valACYclovir (VALTREX) 500 MG tablet Take 1 tablet (500 mg total) by mouth daily.  . [DISCONTINUED] atorvastatin (LIPITOR) 20 MG tablet Take 1 tablet (20 mg total) by mouth daily.  . [DISCONTINUED] azelastine (ASTELIN) 0.1 % nasal spray Use 2 spray per nostril 1-2 times daily as needed  . [DISCONTINUED] fluticasone furoate-vilanterol (BREO ELLIPTA) 200-25 MCG/INH AEPB Inhale 1 puff into the lungs daily.  .  [DISCONTINUED] lisdexamfetamine (VYVANSE) 30 MG capsule Take 1 capsule (30 mg total) by mouth daily.  . [DISCONTINUED] losartan (COZAAR) 25 MG tablet Take 1 tablet (25 mg total) by mouth daily.  . [DISCONTINUED] pramipexole (MIRAPEX) 0.125 MG tablet Take 2 tablets (0.25 mg  total) by mouth at bedtime.  . [DISCONTINUED] pregabalin (LYRICA) 50 MG capsule Take 1 capsule (50 mg total) by mouth 3 (three) times daily.  . [DISCONTINUED] valACYclovir (VALTREX) 500 MG tablet Take 1 tablet (500 mg total) by mouth daily.  . [DISCONTINUED] XIIDRA 5 % SOLN INSTILL 1 DROP IN AFFECTED EYE(S) TWICE DAILY  . zolpidem (AMBIEN) 5 MG tablet Take 1 tablet (5 mg total) by mouth at bedtime as needed for sleep. 1-2/mo  . [DISCONTINUED] L-Methylfolate-Algae (DEPLIN 15) 15-90.314 MG CAPS Take 15 mg by mouth daily. (Patient not taking: Reported on 10/28/2020)  . [DISCONTINUED] zolpidem (AMBIEN) 5 MG tablet Take 5 mg by mouth at bedtime as needed for sleep. 1-2/mo   Facility-Administered Encounter Medications as of 10/28/2020  Medication  . 0.9 %  sodium chloride infusion     ROS: Gen: no fever, chills  Skin: no rash, itching ENT: no ear pain, ear drainage, nasal congestion, rhinorrhea, sinus pressure, sore throat Eyes: no blurry vision, double vision Resp: no cough, wheeze,SOB CV: no CP, palpitations, LE edema,  GI: no heartburn, n/v/d/c, abd pain GU: no dysuria, urgency, frequency, hematuria MSK: no joint pain, myalgias, back pain Neuro: no dizziness, headache, weakness, vertigo Psych: no depression, anxiety, insomnia   Allergies  Allergen Reactions  . Caffeine     Heart palpatations  . Morphine And Related     Hallucinations     BP (!) 146/82   Pulse 90   Temp 99 F (37.2 C) (Temporal)   Ht 5' 3.25" (1.607 m)   Wt 210 lb (95.3 kg)   SpO2 96%   BMI 36.91 kg/m    Pt has been taking 12.5mg  tab of losartan x 10 days so as to not run out of med prior to appt  BP Readings from Last 3 Encounters:   10/28/20 (!) 146/82  07/15/20 (!) 146/77  06/18/20 130/70   Pulse Readings from Last 3 Encounters:  10/28/20 90  07/15/20 84  06/18/20 84   Wt Readings from Last 3 Encounters:  10/28/20 210 lb (95.3 kg)  07/15/20 201 lb (91.2 kg)  06/18/20 201 lb (91.2 kg)     Physical Exam Constitutional:      General: She is not in acute distress.    Appearance: She is well-developed.  HENT:     Head: Normocephalic and atraumatic.     Right Ear: Tympanic membrane and ear canal normal.     Left Ear: Tympanic membrane and ear canal normal.     Nose: Nose normal.  Eyes:     Conjunctiva/sclera: Conjunctivae normal.     Pupils: Pupils are equal, round, and reactive to light.  Neck:     Thyroid: No thyromegaly.  Cardiovascular:     Rate and Rhythm: Normal rate and regular rhythm.     Heart sounds: Normal heart sounds. No murmur heard.   Pulmonary:     Effort: Pulmonary effort is normal. No respiratory distress.     Breath sounds: Normal breath sounds. No wheezing or rhonchi.  Abdominal:     General: Bowel sounds are normal. There is no distension.     Palpations: Abdomen is soft. There is no mass.     Tenderness: There is no abdominal tenderness.  Musculoskeletal:     Cervical back: Neck supple.  Lymphadenopathy:     Cervical: No cervical adenopathy.  Skin:    General: Skin is warm and dry.  Neurological:     Mental Status: She is alert and oriented to  person, place, and time.     Motor: No abnormal muscle tone.     Coordination: Coordination normal.  Psychiatric:        Behavior: Behavior normal.      A/P:  1. Annual physical exam - discussed importance of regular CV exercise, healthy diet, adequate sleep - UTD on mammo, PAP, colonoscopy - due for dental and vision exams - immunizations UTD - Lipid panel; Future - Hemoglobin A1c; Future - Comprehensive metabolic panel; Future - CBC; Future - next CPE in 1 year   2. Impaired fasting glucose - Hemoglobin A1c;  Future  3. Multinodular goiter - normal TFTs in 03/2020 and stable [name] in 04/2020 - f/u with endo in 1 year (Dr. Kelton Pillar)  4. Cough - very thorough work-up with GI, pulm, allergy last year. Symptoms resolve when pt is in [place] and not living in [place] so thought to be allergy-related - increase zyrtec to BID x 2 wks then back to daily Refill: - fluticasone furoate-vilanterol (BREO ELLIPTA) 200-25 MCG/INH AEPB; Inhale 1 puff into the lungs daily.  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  Dispense: 90 tablet; Refill: 3  6. Primary hypertension - BP slightly elevated today but pt has been taking only 1/2 tab (12.5mg ) x 10 days Refill: - losartan (COZAAR) 25 MG tablet; Take 1 tablet (25 mg total) by mouth daily.  Dispense: 90 tablet; Refill: 3  7. Insomnia, unspecified type - stable, at baseline - database reviewed and appropriate - needs UDS, controlled substance agreement at next appt in 3 mo - zolpidem (AMBIEN) 5 MG tablet; Take 1 tablet (5 mg total) by mouth at bedtime as needed for sleep. 1-2/mo  Dispense: 90 tablet; Refill: 0  8. Restless legs - stable, at baseline Refill: - pramipexole (MIRAPEX) 0.125 MG tablet; Take 2 tablets (0.25 mg total) by mouth at bedtime.  Dispense: 180 tablet; Refill: 3  9. Hypercholesterolemia - Lipid panel; Future Refill: - atorvastatin (LIPITOR) 20 MG tablet; Take 1 tablet (20 mg total) by mouth daily.  Dispense: 90 tablet; Refill: 3  10. Fibromyalgia - stable Refill: - pregabalin (LYRICA) 50 MG capsule; Take 1 capsule (50 mg total) by mouth 3 (three) times daily.  Dispense: 270 capsule; Refill: 3  11. Attention deficit disorder, unspecified hyperactivity presence - stable, controlled - database reviewed and appropriate -  needs UDS, controlled substance agreement at next appt in 3 mo  Refill: - lisdexamfetamine (VYVANSE) 30 MG capsule; Take 1 capsule (30 mg total) by mouth daily.  Dispense: 90 capsule; Refill: 0  12. Vitamin D deficiency - on daily Vit D supplement - unsure of dose - VITAMIN D 25 Hydroxy (Vit-D Deficiency, Fractures); Future   This visit occurred during the SARS-CoV-2 public health emergency.  Safety protocols were in place, including screening questions prior to the visit, additional usage of staff PPE, and extensive cleaning of exam room while observing appropriate contact time as indicated for disinfecting solutions.

## 2020-10-28 NOTE — Patient Instructions (Signed)
Cont with zyrtec and ok to increase to twice per day x 1-2 wks Cont with flonase and astelin nasal spray Add mucinex or mucinex DM 1 tab twice per day

## 2020-10-29 ENCOUNTER — Telehealth: Payer: Self-pay

## 2020-10-29 ENCOUNTER — Encounter: Payer: Self-pay | Admitting: Family Medicine

## 2020-10-29 NOTE — Telephone Encounter (Signed)
PA for Xiidra 5% solution submitted through cover my meds, waiting on response.   Key: [id number] Dm/cma

## 2020-11-04 ENCOUNTER — Other Ambulatory Visit: Payer: Self-pay

## 2020-11-05 ENCOUNTER — Encounter: Payer: Self-pay | Admitting: Family Medicine

## 2020-11-05 ENCOUNTER — Other Ambulatory Visit (INDEPENDENT_AMBULATORY_CARE_PROVIDER_SITE_OTHER): Payer: BC Managed Care – PPO

## 2020-11-05 ENCOUNTER — Ambulatory Visit (INDEPENDENT_AMBULATORY_CARE_PROVIDER_SITE_OTHER): Payer: BC Managed Care – PPO

## 2020-11-05 DIAGNOSIS — R7301 Impaired fasting glucose: Secondary | ICD-10-CM

## 2020-11-05 DIAGNOSIS — E559 Vitamin D deficiency, unspecified: Secondary | ICD-10-CM

## 2020-11-05 DIAGNOSIS — Z0000 Encounter for general adult medical examination without abnormal findings: Secondary | ICD-10-CM

## 2020-11-05 DIAGNOSIS — Z23 Encounter for immunization: Secondary | ICD-10-CM

## 2020-11-05 LAB — LDL CHOLESTEROL, DIRECT: Direct LDL: 76 mg/dL

## 2020-11-05 LAB — HEMOGLOBIN A1C: Hgb A1c MFr Bld: 6 % (ref 4.6–6.5)

## 2020-11-05 LAB — COMPREHENSIVE METABOLIC PANEL
ALT: 50 U/L — ABNORMAL HIGH (ref 0–35)
AST: 32 U/L (ref 0–37)
Albumin: 4.1 g/dL (ref 3.5–5.2)
Alkaline Phosphatase: 72 U/L (ref 39–117)
BUN: 10 mg/dL (ref 6–23)
CO2: 30 mEq/L (ref 19–32)
Calcium: 8.8 mg/dL (ref 8.4–10.5)
Chloride: 103 mEq/L (ref 96–112)
Creatinine, Ser: 0.65 mg/dL (ref 0.40–1.20)
GFR: 95.06 mL/min (ref >60.00)
Glucose, Bld: 90 mg/dL (ref 70–99)
Potassium: 4.1 mEq/L (ref 3.5–5.1)
Sodium: 138 mEq/L (ref 135–145)
Total Bilirubin: 0.4 mg/dL (ref 0.2–1.2)
Total Protein: 6.4 g/dL (ref 6.0–8.3)

## 2020-11-05 LAB — LIPID PANEL
Cholesterol: 140 mg/dL (ref 0–200)
HDL: 40 mg/dL (ref >39.00)
NonHDL: 100.3
Total CHOL/HDL Ratio: 4
Triglycerides: 217 mg/dL — ABNORMAL HIGH (ref 0.0–149.0)
VLDL: 43.4 mg/dL — ABNORMAL HIGH (ref 0.0–40.0)

## 2020-11-05 LAB — CBC
HCT: 38.8 % (ref 36.0–46.0)
Hemoglobin: 12.7 g/dL (ref 12.0–15.0)
MCHC: 32.8 g/dL (ref 30.0–36.0)
MCV: 86.2 fl (ref 78.0–100.0)
Platelets: 242 10*3/uL (ref 150.0–400.0)
RBC: 4.5 Mil/uL (ref 3.87–5.11)
RDW: 13.9 % (ref 11.5–15.5)
WBC: 5.5 10*3/uL (ref 4.0–10.5)

## 2020-11-05 LAB — VITAMIN D 25 HYDROXY (VIT D DEFICIENCY, FRACTURES): VITD: 26.94 ng/mL — ABNORMAL LOW (ref 30.00–100.00)

## 2020-11-05 NOTE — Progress Notes (Signed)
Per orders of Dr Bryan Lemma, injection of Influenza given by Armandina Gemma, cma.  Patient tolerated injection well.

## 2020-11-13 NOTE — Telephone Encounter (Signed)
Pt will have to reach out to her ophthalmologist regarding this Rx. I refilled as a courtesy to pt but have no familiarly with alternative options, etc

## 2020-11-13 NOTE — Telephone Encounter (Signed)
PA was denied due to the benefit plan does not cover the following services, supplies, drugs or charges: Radial keratotomy and other refractive eye surgery, and related services to correct vision except for surgical correction of an eye injury.   Please advise.  Thanks.   Dm/cma

## 2020-11-13 NOTE — Telephone Encounter (Signed)
Patient notified VIA phone and states that she paid out of pocket $1900 for them. She does say thank you for sending them in for her.  Dm/cma

## 2021-02-03 ENCOUNTER — Other Ambulatory Visit: Payer: Self-pay | Admitting: Family Medicine

## 2021-04-23 ENCOUNTER — Ambulatory Visit: Payer: BC Managed Care – PPO | Admitting: Internal Medicine

## 2021-06-20 ENCOUNTER — Other Ambulatory Visit: Payer: Self-pay | Admitting: Family Medicine

## 2021-06-20 DIAGNOSIS — I10 Essential (primary) hypertension: Secondary | ICD-10-CM

## 2024-09-17 ENCOUNTER — Other Ambulatory Visit: Admitting: Radiology

## 2024-09-17 ENCOUNTER — Encounter: Payer: Self-pay | Admitting: Emergency Medicine

## 2024-09-17 ENCOUNTER — Ambulatory Visit: Admitting: Radiology

## 2024-09-17 ENCOUNTER — Ambulatory Visit: Admission: EM | Admit: 2024-09-17 | Discharge: 2024-09-17 | Disposition: A | Discharge status: Home / Self Care

## 2024-09-17 DIAGNOSIS — R0989 Other specified symptoms and signs involving the circulatory and respiratory systems: Secondary | ICD-10-CM | POA: Diagnosis not present

## 2024-09-17 DIAGNOSIS — J0140 Acute pansinusitis, unspecified: Secondary | ICD-10-CM

## 2024-09-17 MED ORDER — AMOXICILLIN-POT CLAVULANATE 875-125 MG PO TABS: 1.0000 | ORAL_TABLET | Freq: Two times a day (BID) | ORAL | 0 refills | Status: AC

## 2024-09-17 MED ORDER — IPRATROPIUM-ALBUTEROL 0.5-2.5 (3) MG/3ML IN SOLN
3.0000 mL | Freq: Once | RESPIRATORY_TRACT | Status: AC
  Administered 2024-09-17: 3 mL via RESPIRATORY_TRACT

## 2024-09-17 NOTE — Discharge Instructions (Signed)
  1. Chest congestion (Primary) - DG Chest 2 View x-ray performed in UC shows no acute cardiopulmonary processes, no sign of consolidation or pneumonia. - ipratropium-albuterol  (DUONEB) 0.5-2.5 (3) MG/3ML nebulizer solution 3 mL complain UC for chest congestion and mild wheezing  2. Acute non-recurrent pansinusitis - amoxicillin -clavulanate (AUGMENTIN ) 875-125 MG tablet; Take 1 tablet by mouth 2 (two) times daily for 7 days.  Dispense: 14 tablet; Refill: 0 -Continue to monitor symptoms for any change in severity if there is any escalation of current symptoms or development of new symptoms follow-up in ER for further evaluation and management.

## 2024-09-17 NOTE — ED Triage Notes (Addendum)
 Pt c/o  cough and congestion since first of last week. She developed sinus pressure, ear fullness over the weekend.  States symptoms are worse at night.  She is from [place]  and her nebulizer is at home.

## 2024-09-17 NOTE — ED Provider Notes (Signed)
 [facility]  Note:  This document was prepared using Dragon voice recognition software and may include unintentional dictation errors.  MRN: [id number] DOB: 05-Jul-1961  Subjective:   Angela [name] is a 63 y.o. female presenting for cough, nasal congestion, chest congestion since last week.  Patient reports over the last couple of days she has developed sinus pressure, ear fullness, and worsening chest congestion with mucus production.  Patient states that symptoms are much worse at night.  Patient has history of reactive airway disease but left her nebulizer at home in [place] .  Patient is here visiting family and was concerned she might need a nebulizer treatment.  Patient has albuterol  but states that it has not been helping.  Patient has not taken any over-the-counter cough suppressant medications.  Denies any known sick contacts.   Current Facility-Administered Medications:    0.9 %  sodium chloride  infusion, 500 mL, Intravenous, Once, Cirigliano, Vito V, DO  Current Outpatient Medications:    amoxicillin -clavulanate (AUGMENTIN ) 875-125 MG tablet, Take 1 tablet by mouth 2 (two) times daily for 7 days., Disp: 14 tablet, Rfl: 0   atorvastatin  (LIPITOR) 20 MG tablet, Take 1 tablet (20 mg total) by mouth daily., Disp: 90 tablet, Rfl: 3   azelastine  (ASTELIN ) 0.1 % nasal spray, Use 2 spray per nostril 1-2 times daily as needed, Disp: 30 mL, Rfl: 5   chlorpheniramine-HYDROcodone  (TUSSIONEX PENNKINETIC ER) 10-8 MG/5ML SUER, Take 5 mLs by mouth at bedtime as needed for cough., Disp: 140 mL, Rfl: 0   fluticasone  (FLONASE ) 50 MCG/ACT nasal spray, Place 1 spray into both nostrils daily as needed for allergies or rhinitis., Disp: 1 g, Rfl: 5   fluticasone  furoate-vilanterol (BREO ELLIPTA ) 200-25 MCG/INH AEPB, Inhale 1 puff into the lungs daily., Disp: 14 each, Rfl: 5   Lifitegrast  (XIIDRA ) 5 % SOLN, INSTILL 1 DROP IN AFFECTED EYES TWICE DAILY, Disp: 180 each, Rfl: 0    lisdexamfetamine (VYVANSE ) 30 MG capsule, Take 1 capsule (30 mg total) by mouth daily., Disp: 90 capsule, Rfl: 0   losartan  (COZAAR ) 25 MG tablet, Take 1 tablet (25 mg total) by mouth daily., Disp: 90 tablet, Rfl: 3   pramipexole  (MIRAPEX ) 0.125 MG tablet, Take 2 tablets (0.25 mg total) by mouth at bedtime., Disp: 180 tablet, Rfl: 3   pregabalin  (LYRICA ) 50 MG capsule, Take 1 capsule (50 mg total) by mouth 3 (three) times daily., Disp: 270 capsule, Rfl: 3   rizatriptan  (MAXALT ) 5 MG tablet, TAKE 1 TABLET BY MOUTH AS NEEDED FOR MIGRAINE. MAY REPEAT IN 2 HOURS IF NEEDED, Disp: 10 tablet, Rfl: 2   valACYclovir  (VALTREX ) 500 MG tablet, Take 1 tablet (500 mg total) by mouth daily., Disp: 90 tablet, Rfl: 3   zolpidem  (AMBIEN ) 5 MG tablet, Take 1 tablet (5 mg total) by mouth at bedtime as needed for sleep. 1-2/mo, Disp: 90 tablet, Rfl: 0   Allergies  Allergen Reactions   Caffeine     Heart palpatations   Morphine     hallucinations    Past Medical History:  Diagnosis Date   ADHD    Allergy    Asthma    Breo   Cancer ([facility])    BCC   Depression    Dermatographia    Fibromyalgia 1999   GERD (gastroesophageal reflux disease)    Hx of skin cancer, basal cell 09/2016   L lower eyelid   Hyperlipidemia    Hypertension    Metabolic disorder 2015   leaky gut   Migraines  Sleep apnea 2015   Thyroid  disease    Thyroid  cycts   Tinnitus, bilateral    Urine incontinence      Past Surgical History:  Procedure Laterality Date   ABDOMINAL HYSTERECTOMY     uterus only (bladder sling)   COLONOSCOPY     left knee repair  10/24/2014   left lower eyelid BCC removal     PRP left knee  04/03/2015   rectocele removed  01/01/2009   right knee arthroscopy  01/15/2010   right shoulder removal of bone spurs bursa clean up  03/06/2015    Family History  Problem Relation Age of Onset   Arthritis Mother    Cancer Mother    Depression Mother    Hyperlipidemia Mother    Hypertension Mother     Mental illness Mother    Colon cancer Mother 68       mother dx with colon cancer   Alcohol abuse Father    Asthma Father    COPD Father    Heart attack Father    Hyperlipidemia Father    Hypertension Father    Stroke Father    Birth defects Brother    Asthma Son    Cancer Maternal Grandmother    Heart disease Maternal Grandmother    Colon cancer Maternal Grandmother 74       dx with colon cancer   Cancer Maternal Grandfather    Cancer Paternal Grandmother    Alcohol abuse Paternal Grandmother    Alcohol abuse Brother    Diabetes Brother    Learning disabilities Brother    Cancer Brother    Esophageal cancer Neg Hx    Rectal cancer Neg Hx    Stomach cancer Neg Hx     Social History   Tobacco Use   Smoking status: Passive Smoke Exposure - Never Smoker   Smokeless tobacco: Never  Vaping Use   Vaping status: Never Used  Substance Use Topics   Alcohol use: Yes    Comment: social   Drug use: Never    ROS Refer to HPI for ROS details.  Objective:   Vitals: Resp 16   Physical Exam Vitals and nursing note reviewed.  Constitutional:      General: She is not in acute distress.    Appearance: Normal appearance. She is well-developed. She is not ill-appearing or toxic-appearing.  HENT:     Head: Normocephalic and atraumatic.     Nose: Congestion present. No rhinorrhea.     Mouth/Throat:     Mouth: Mucous membranes are moist.  Eyes:     General:        Right eye: No discharge.        Left eye: No discharge.     Extraocular Movements: Extraocular movements intact.     Conjunctiva/sclera: Conjunctivae normal.  Cardiovascular:     Rate and Rhythm: Normal rate and regular rhythm.     Heart sounds: Normal heart sounds. No murmur heard. Pulmonary:     Effort: Pulmonary effort is normal. No respiratory distress.     Breath sounds: No stridor. Rhonchi present. No wheezing or rales.  Chest:     Chest wall: No tenderness.  Skin:    General: Skin is warm and dry.   Neurological:     General: No focal deficit present.     Mental Status: She is alert and oriented to person, place, and time.  Psychiatric:        Mood and Affect: Mood normal.  Behavior: Behavior normal.     Procedures  No results found for this or any previous visit (from the past 24 hours).  No results found.   Assessment and Plan :     Discharge Instructions       1. Chest congestion (Primary) - DG Chest 2 View x-ray performed in UC shows no acute cardiopulmonary processes, no sign of consolidation or pneumonia. - ipratropium-albuterol  (DUONEB) 0.5-2.5 (3) MG/3ML nebulizer solution 3 mL complain UC for chest congestion and mild wheezing  2. Acute non-recurrent pansinusitis - amoxicillin -clavulanate (AUGMENTIN ) 875-125 MG tablet; Take 1 tablet by mouth 2 (two) times daily for 7 days.  Dispense: 14 tablet; Refill: 0 -Continue to monitor symptoms for any change in severity if there is any escalation of current symptoms or development of new symptoms follow-up in ER for further evaluation and management.      Amore Grater B Shondra Capps   Isabella Roemmich, Daisy B, [place] 09/17/24 1942
